# Patient Record
Sex: Female | Born: 1950 | Race: White | Hispanic: No | Marital: Married
Health system: Northeastern US, Academic
[De-identification: ages and names within clinical notes are randomized; demographics above are authoritative.]

---

## 2019-10-16 ENCOUNTER — Encounter: Admit: 2019-10-16 | Payer: PRIVATE HEALTH INSURANCE

## 2019-10-17 ENCOUNTER — Ambulatory Visit: Admit: 2019-10-17 | Payer: PRIVATE HEALTH INSURANCE

## 2019-10-17 DIAGNOSIS — Z23 Encounter for immunization: Secondary | ICD-10-CM

## 2019-11-03 ENCOUNTER — Inpatient Hospital Stay: Admit: 2019-11-03 | Payer: PRIVATE HEALTH INSURANCE | Attending: Certified Registered"

## 2019-11-03 ENCOUNTER — Inpatient Hospital Stay: Admit: 2019-11-03 | Payer: PRIVATE HEALTH INSURANCE

## 2019-11-03 ENCOUNTER — Encounter: Admit: 2019-11-03 | Payer: PRIVATE HEALTH INSURANCE

## 2019-11-03 ENCOUNTER — Inpatient Hospital Stay: Admit: 2019-11-03 | Discharge: 2019-11-10 | Payer: MEDICARE | Source: Home / Self Care | Admitting: Hospitalist

## 2019-11-03 DIAGNOSIS — K219 Gastro-esophageal reflux disease without esophagitis: Secondary | ICD-10-CM

## 2019-11-03 DIAGNOSIS — I10 Essential (primary) hypertension: Secondary | ICD-10-CM

## 2019-11-03 DIAGNOSIS — Z6839 Body mass index (BMI) 39.0-39.9, adult: Secondary | ICD-10-CM

## 2019-11-03 DIAGNOSIS — G250 Essential tremor: Secondary | ICD-10-CM

## 2019-11-03 DIAGNOSIS — F1020 Alcohol dependence, uncomplicated: Secondary | ICD-10-CM

## 2019-11-03 DIAGNOSIS — S93409A Sprain of unspecified ligament of unspecified ankle, initial encounter: Secondary | ICD-10-CM

## 2019-11-03 DIAGNOSIS — E669 Obesity, unspecified: Secondary | ICD-10-CM

## 2019-11-03 DIAGNOSIS — E785 Hyperlipidemia, unspecified: Secondary | ICD-10-CM

## 2019-11-03 DIAGNOSIS — K922 Gastrointestinal hemorrhage, unspecified: Secondary | ICD-10-CM

## 2019-11-03 DIAGNOSIS — F1011 Alcohol abuse, in remission: Secondary | ICD-10-CM

## 2019-11-03 DIAGNOSIS — L719 Rosacea, unspecified: Secondary | ICD-10-CM

## 2019-11-03 DIAGNOSIS — F32A Depressive disorder: Secondary | ICD-10-CM

## 2019-11-03 DIAGNOSIS — F411 Generalized anxiety disorder: Secondary | ICD-10-CM

## 2019-11-03 LAB — CBC WITH AUTO DIFFERENTIAL
BKR WAM ABSOLUTE IMMATURE GRANULOCYTES.: 0.03 x 1000/ÂµL (ref 0.00–0.10)
BKR WAM ABSOLUTE IMMATURE GRANULOCYTES.: 0.05 x 1000/ÂµL (ref 0.00–0.10)
BKR WAM ABSOLUTE IMMATURE GRANULOCYTES.: 0.02 x 1000/ÂµL (ref 0.00–0.10)
BKR WAM ABSOLUTE LYMPHOCYTE COUNT.: 1.17 x 1000/ÂµL (ref 1.00–4.50)
BKR WAM ABSOLUTE LYMPHOCYTE COUNT.: 0.64 x 1000/ÂµL — ABNORMAL LOW (ref 1.00–4.50)
BKR WAM ABSOLUTE LYMPHOCYTE COUNT.: 0.9 x 1000/ÂµL — ABNORMAL LOW (ref 1.00–4.50)
BKR WAM ABSOLUTE NEUTROPHIL COUNT.: 4.13 x 1000/ÂµL (ref 1.40–6.60)
BKR WAM ABSOLUTE NEUTROPHIL COUNT.: 4.35 x 1000/ÂµL (ref 1.40–6.60)
BKR WAM ABSOLUTE NEUTROPHIL COUNT.: 3.12 x 1000/ÂµL (ref 1.40–6.60)
BKR WAM BASOPHIL ABSOLUTE COUNT.: 0.06 x 1000/ÂµL (ref 0.0–0.3)
BKR WAM BASOPHIL ABSOLUTE COUNT.: 0.04 x 1000/ÂµL (ref 0.0–0.3)
BKR WAM BASOPHIL ABSOLUTE COUNT.: 0.02 x 1000/ÂµL (ref 0.0–0.3)
BKR WAM BASOPHILS: 0.9 % (ref 0.0–3.0)
BKR WAM BASOPHILS: 0.7 % (ref 0.0–3.0)
BKR WAM BASOPHILS: 0.4 % (ref 0.0–3.0)
BKR WAM EOSINOPHIL ABSOLUTE COUNT.: 0.16 x 1000/ÂµL (ref 0.00–0.45)
BKR WAM EOSINOPHIL ABSOLUTE COUNT.: 0.01 x 1000/ÂµL (ref 0.00–0.45)
BKR WAM EOSINOPHIL ABSOLUTE COUNT.: 0.02 x 1000/ÂµL (ref 0.00–0.45)
BKR WAM EOSINOPHILS: 2.5 % (ref 0.0–4.0)
BKR WAM EOSINOPHILS: 0.2 % (ref 0.0–4.0)
BKR WAM EOSINOPHILS: 0.4 % (ref 0.0–4.0)
BKR WAM HEMATOCRIT: 36.4 % (ref 34.0–45.0)
BKR WAM HEMATOCRIT: 33.7 % — ABNORMAL LOW (ref 34.0–45.0)
BKR WAM HEMATOCRIT: 29 % — ABNORMAL LOW (ref 34.0–45.0)
BKR WAM HEMOGLOBIN: 12.1 g/dL (ref 11.0–15.0)
BKR WAM HEMOGLOBIN: 10.9 g/dL — ABNORMAL LOW (ref 11.0–15.0)
BKR WAM HEMOGLOBIN: 9.8 g/dL — ABNORMAL LOW (ref 11.0–15.0)
BKR WAM IMMATURE GRANULOCYTES: 0.5 % (ref 0.0–1.0)
BKR WAM IMMATURE GRANULOCYTES: 0.9 % (ref 0.0–1.0)
BKR WAM IMMATURE GRANULOCYTES: 0.4 % (ref 0.0–1.0)
BKR WAM LYMPHOCYTES: 18 % — ABNORMAL LOW (ref 25.0–45.0)
BKR WAM LYMPHOCYTES: 11 % — ABNORMAL LOW (ref 25.0–45.0)
BKR WAM LYMPHOCYTES: 17.5 % — ABNORMAL LOW (ref 25.0–45.0)
BKR WAM MCH PG: 31.8 pg (ref 27–33)
BKR WAM MCH PG: 31.1 pg (ref 27–33)
BKR WAM MCH PG: 31.8 pg (ref 27–33)
BKR WAM MCHC: 33.2 g/dL (ref 32.0–36.0)
BKR WAM MCHC: 32.3 g/dL (ref 32.0–36.0)
BKR WAM MCHC: 33.8 g/dL (ref 32.0–36.0)
BKR WAM MCV: 95.5 fL (ref 79.0–99.0)
BKR WAM MCV: 96.3 fL (ref 79.0–99.0)
BKR WAM MCV: 94.2 fL (ref 79.0–99.0)
BKR WAM MONOCYTE ABSOLUTE COUNT.: 0.95 x 1000/ÂµL (ref 0.00–1.20)
BKR WAM MONOCYTE ABSOLUTE COUNT.: 0.72 x 1000/ÂµL (ref 0.00–1.20)
BKR WAM MONOCYTE ABSOLUTE COUNT.: 1.07 x 1000/ÂµL (ref 0.00–1.20)
BKR WAM MONOCYTES: 14.6 % — ABNORMAL HIGH (ref 0.0–12.0)
BKR WAM MONOCYTES: 12.4 % — ABNORMAL HIGH (ref 0.0–12.0)
BKR WAM MONOCYTES: 20.8 % — ABNORMAL HIGH (ref 0.0–12.0)
BKR WAM MPV: 8.9 fL (ref 7.5–11.5)
BKR WAM MPV: 8.9 fL (ref 7.5–11.5)
BKR WAM MPV: 9 fL (ref 7.5–11.5)
BKR WAM NEUTROPHILS: 63.5 % (ref 36.0–66.0)
BKR WAM NEUTROPHILS: 74.8 % — ABNORMAL HIGH (ref 36.0–66.0)
BKR WAM NEUTROPHILS: 60.5 % (ref 36.0–66.0)
BKR WAM NUCLEATED RED BLOOD CELLS: 0 % (ref 0.0–5.0)
BKR WAM NUCLEATED RED BLOOD CELLS: 0 % (ref 0.0–5.0)
BKR WAM NUCLEATED RED BLOOD CELLS: 0 % (ref 0.0–5.0)
BKR WAM PLATELETS: 196 x1000/ÂµL (ref 140–400)
BKR WAM PLATELETS: 184 x1000/ÂµL (ref 140–400)
BKR WAM PLATELETS: 159 x1000/ÂµL (ref 140–400)
BKR WAM RDW-CV: 13 % (ref 11.5–14.5)
BKR WAM RDW-CV: 12.8 % (ref 11.5–14.5)
BKR WAM RDW-CV: 13 % (ref 11.5–14.5)
BKR WAM RED BLOOD CELL COUNT.: 3.81 M/ÂµL — ABNORMAL LOW (ref 4.00–5.20)
BKR WAM RED BLOOD CELL COUNT.: 3.5 M/ÂµL — ABNORMAL LOW (ref 4.00–5.20)
BKR WAM RED BLOOD CELL COUNT.: 3.08 M/ÂµL — ABNORMAL LOW (ref 4.00–5.20)
BKR WAM WHITE BLOOD CELL COUNT.: 6.5 x1000/ÂµL (ref 4.00–10.00)
BKR WAM WHITE BLOOD CELL COUNT.: 5.81 x1000/ÂµL (ref 4.00–10.00)
BKR WAM WHITE BLOOD CELL COUNT.: 5.15 x1000/ÂµL (ref 4.00–10.00)

## 2019-11-03 LAB — BASIC METABOLIC PANEL
BKR ANION GAP (LM): 13 mmol/L (ref 5–15)
BKR BLOOD UREA NITROGEN: 19 mg/dL — ABNORMAL HIGH (ref 7–18)
BKR CALCIUM: 8.8 mg/dL (ref 8.5–10.1)
BKR CHLORIDE: 103 mmol/L (ref 98–107)
BKR CO2: 22 mmol/L (ref 21–32)
BKR CREATININE: 1.18 mg/dL — ABNORMAL HIGH (ref 0.55–1.02)
BKR EGFR (AFR AMER) (LMC): 55 mL/min/{1.73_m2} (ref >60)
BKR EGFR (NON AFR AMER) (LMC): 45 mL/min/{1.73_m2} (ref >60)
BKR GLUCOSE: 121 mg/dL — ABNORMAL HIGH (ref 65–110)
BKR POTASSIUM: 4.8 mmol/L (ref 3.5–5.1)
BKR SODIUM: 138 mmol/L (ref 136–145)

## 2019-11-03 LAB — BLOOD GAS, ARTERIAL     (LMW)
BKR AA GRADIENT LM: 412 mmHg
BKR AA GRADIENT LM: 259 mmHg
BKR FIO2: 70 %
BKR FIO2: 70 %
BKR HCO3, ARTERIAL: 25.6 mmol/L (ref 22.0–28.0)
BKR HCO3, ARTERIAL: 25.5 mmol/L (ref 22.0–28.0)
BKR PCO2 ARTERIAL: 41 mmHg (ref 35–45)
BKR PCO2 ARTERIAL: 41 mmHg (ref 35–45)
BKR PH, ARTERIAL: 7.41 U (ref 7.35–7.45)
BKR PH, ARTERIAL: 7.41 U (ref 7.35–7.45)
BKR PO2, ARTERIAL: 36 mmHg — CL (ref 80–100)
BKR PO2, ARTERIAL: 189 mmHg — ABNORMAL HIGH (ref 80–100)

## 2019-11-03 LAB — HEPATIC FUNCTION PANEL
BKR ALANINE AMINOTRANSFERASE (ALT): 75 U/L (ref 12–78)
BKR ALBUMIN: 3 g/dL — ABNORMAL LOW (ref 3.4–5.0)
BKR ALKALINE PHOSPHATASE: 96 U/L (ref 45–117)
BKR ASPARTATE AMINOTRANSFERASE (AST): 82 U/L — ABNORMAL HIGH (ref 15–37)
BKR BILIRUBIN DIRECT (LMC LM): <0.1 mg/dL (ref 0.00–0.20)
BKR BILIRUBIN TOTAL: 0.4 mg/dL (ref 0.2–1.0)
BKR GLOBULIN: 3.5 g/dL (ref 2.5–5.0)
BKR PROTEIN TOTAL: 6.5 g/dL (ref 6.4–8.2)

## 2019-11-03 LAB — URINALYSIS WITH CULTURE REFLEX      (BH LMW YH)
BKR BILIRUBIN, UA MG/DL: NEGATIVE mg/dL
BKR GLUCOSE, UA MG/DL: NEGATIVE mg/dL
BKR HYALINE CASTS, UA INSTRUMENT: 21 /LPF — ABNORMAL HIGH (ref 0–2)
BKR KETONES, UA MG/DL: NEGATIVE mg/dL
BKR NITRITE, UA: POSITIVE — AB
BKR PH, UA: 6 (ref 5.0–7.0)
BKR PROTEIN, UA.: NEGATIVE mg/dL
BKR RBC/HPF INSTRUMENT: 4 /HPF — ABNORMAL HIGH (ref 0–3)
BKR SPECIFIC GRAVITY, UA: 1.012 (ref 1.003–1.035)
BKR URINE SQUAMOUS EPITHELIAL CELLS, UA (NUMERIC): 1 /HPF (ref 0–5)
BKR UROBILINOGEN, UA - ALPHA: NEGATIVE EU/dL
BKR WBC/HPF INSTRUMENT: 20 /HPF — ABNORMAL HIGH (ref 0–5)

## 2019-11-03 LAB — PROTIME AND INR
BKR PROTHROMBIN TIME: 10.7 s (ref 9.9–11.8)
ZZZBKR INR 1 DEC: 1 (ref 0.9–1.1)

## 2019-11-03 LAB — IRON AND TIBC
BKR IRON SATURATION (SCCCT BH GH LM): 24 % — ABNORMAL LOW (ref 27–40)
BKR IRON: 73 ug/dL (ref 50–170)
BKR TOTAL IRON BINDING CAPACITY (SCCCT  BH GH LM): 307 ug/dL (ref 250–450)

## 2019-11-03 LAB — PHOSPHORUS     (BH GH L LMW YH): BKR PHOSPHORUS: 3.8 mg/dL (ref 2.5–4.9)

## 2019-11-03 LAB — LACTIC ACID, PLASMA: BKR LACTATE: 5.2 mmol/L — CR (ref 0.4–2.0)

## 2019-11-03 LAB — AMMONIA     (BH GH L LMW YH): BKR AMMONIA: 31 umol/L (ref 11–32)

## 2019-11-03 LAB — REFLEX LACTIC ACID, PLASMA: BKR LACTATE: 1.2 mmol/L (ref 0.4–2.0)

## 2019-11-03 LAB — SARS COV-2 (COVID-19) RNA: BKR SARS-COV-2 RNA (COVID-19) (YH): NEGATIVE

## 2019-11-03 LAB — FERRITIN: BKR FERRITIN: 271 ng/mL (ref 10–291)

## 2019-11-03 LAB — MAGNESIUM: BKR MAGNESIUM: 1.7 mg/dL (ref 1.6–2.6)

## 2019-11-03 LAB — UA REFLEX CULTURE

## 2019-11-03 LAB — ETHANOL     (BH GH L LMW YH): BKR ETHANOL (LM WH): 0.07 g/dL — ABNORMAL HIGH (ref <0.01)

## 2019-11-03 LAB — LACTIC ACID, PLASMA (REFLEX 2H REPEAT): BKR LACTATE: 4.3 mmol/L — CR (ref 0.4–2.0)

## 2019-11-03 MED ORDER — THIAMINE HCL (VITAMIN B1) 100 MG/ML INJECTION SOLUTION
100 mg/mL | Freq: Every day | INTRAVENOUS | Status: DC
Start: 2019-11-03 — End: 2019-11-05
  Administered 2019-11-03 – 2019-11-04 (×2): 100 mL via INTRAVENOUS

## 2019-11-03 MED ORDER — CHLORHEXIDINE GLUCONATE 0.12 % MOUTHWASH
0.12 % | Freq: Two times a day (BID) | OROMUCOSAL | Status: DC
Start: 2019-11-03 — End: 2019-11-08
  Administered 2019-11-04 – 2019-11-07 (×8): 0.12 mL via OROMUCOSAL

## 2019-11-03 MED ORDER — POLYETHYLENE GLYCOL ORAL POWDER (BOWEL PREP)
17 gram/dose | Freq: Once | ORAL | Status: CP
Start: 2019-11-03 — End: ?
  Administered 2019-11-03: 22:00:00 17 gram/dose via ORAL

## 2019-11-03 MED ORDER — SODIUM CHLORIDE 0.9 % (FLUSH) INJECTION SYRINGE
0.9 % | INTRAVENOUS | Status: DC | PRN
Start: 2019-11-03 — End: 2019-11-10

## 2019-11-03 MED ORDER — PANTOPRAZOLE IV PUSH 40 MG VIAL & NS (ADULTS)
Freq: Two times a day (BID) | INTRAVENOUS | Status: DC
Start: 2019-11-03 — End: 2019-11-05
  Administered 2019-11-04 – 2019-11-05 (×3): 10.000 mL via INTRAVENOUS

## 2019-11-03 MED ORDER — PANTOPRAZOLE IV PUSH 40 MG VIAL & NS (ADULTS)
Freq: Two times a day (BID) | INTRAVENOUS | Status: DC
Start: 2019-11-03 — End: 2019-11-03

## 2019-11-03 MED ORDER — PANTOPRAZOLE 40 MG INTRAVENOUS SOLUTION
40 mg | Freq: Once | INTRAVENOUS | Status: CP
Start: 2019-11-03 — End: ?
  Administered 2019-11-03: 08:00:00 40 mL via INTRAVENOUS

## 2019-11-03 MED ORDER — FOLIC ACID 1 MG TABLET
1 mg | Freq: Every day | ORAL | Status: DC
Start: 2019-11-03 — End: 2019-11-10
  Administered 2019-11-06 – 2019-11-10 (×4): 1 mg via ORAL

## 2019-11-03 MED ORDER — ROCURONIUM 10 MG/ML INTRAVENOUS SOLUTION
10 mg/mL | Freq: Once | INTRAVENOUS | Status: CP
Start: 2019-11-03 — End: ?
  Administered 2019-11-03: 19:00:00 10 mL via INTRAVENOUS

## 2019-11-03 MED ORDER — MIDAZOLAM (PF) 1 MG/ML INJECTION SOLUTION
1 mg/mL | INTRAVENOUS | Status: DC | PRN
Start: 2019-11-03 — End: 2019-11-03

## 2019-11-03 MED ORDER — CEFTRIAXONE IV PUSH 1000 MG VIAL & STERILE WATER (ADULTS)
INTRAVENOUS | Status: CP
Start: 2019-11-03 — End: ?
  Administered 2019-11-03 – 2019-11-09 (×7): 10.000 mL via INTRAVENOUS

## 2019-11-03 MED ORDER — EPHEDRINE (PF) 25 MG/5 ML (5 MG/ML) IN 0.9% SODIUM CHLORIDE IV SYRINGE
25 mg/5 mL (5 mg/mL) | Status: CP
Start: 2019-11-03 — End: ?

## 2019-11-03 MED ORDER — THIAMINE HCL (VITAMIN B1) 100 MG TABLET
100 mg | Freq: Every day | ORAL | Status: DC
Start: 2019-11-03 — End: 2019-11-05

## 2019-11-03 MED ORDER — ALBUTEROL SULFATE 2.5 MG/3 ML (0.083 %) SOLUTION FOR NEBULIZATION
2.530.083 mg /3 mL (0.083 %) | RESPIRATORY_TRACT | Status: DC | PRN
Start: 2019-11-03 — End: 2019-11-10

## 2019-11-03 MED ORDER — MIDAZOLAM 1 MG/ML IN 0.9 % SODIUM CHLORIDE INTRAVENOUS
1 mg/mL | INTRAVENOUS | Status: DC | PRN
Start: 2019-11-03 — End: 2019-11-03

## 2019-11-03 MED ORDER — ONDANSETRON HCL (PF) 4 MG/2 ML INJECTION SOLUTION
4 mg/2 mL | Freq: Once | INTRAVENOUS | Status: CP
Start: 2019-11-03 — End: ?
  Administered 2019-11-03: 13:00:00 4 mL via INTRAVENOUS

## 2019-11-03 MED ORDER — OCTREOTIDE ACETATE 100 MCG/ML INJECTION SOLUTION
100 mcg/mL | Status: DC
Start: 2019-11-03 — End: 2019-11-03

## 2019-11-03 MED ORDER — LORAZEPAM 2 MG/ML INJECTION SOLUTION
2 mg/mL | INTRAVENOUS | Status: DC | PRN
Start: 2019-11-03 — End: 2019-11-03
  Administered 2019-11-03: 13:00:00 2 mL via INTRAVENOUS

## 2019-11-03 MED ORDER — SODIUM CHLORIDE 0.9 % INTRAVENOUS SOLUTION
INTRAVENOUS | Status: DC
Start: 2019-11-03 — End: 2019-11-03
  Administered 2019-11-03: 19:00:00 via INTRAVENOUS

## 2019-11-03 MED ORDER — MIDAZOLAM 1 MG/ML IN 0.9 % SODIUM CHLORIDE INTRAVENOUS
1 mg/mL | INTRAVENOUS | Status: DC
Start: 2019-11-03 — End: 2019-11-08
  Administered 2019-11-04 – 2019-11-07 (×17): 1 mL/h via INTRAVENOUS

## 2019-11-03 MED ORDER — ONDANSETRON HCL (PF) 4 MG/2 ML INJECTION SOLUTION
4 mg/2 mL | Freq: Once | INTRAVENOUS | Status: DC
Start: 2019-11-03 — End: 2019-11-08

## 2019-11-03 MED ORDER — MAGNESIUM CITRATE ORAL SOLUTION
Freq: Once | ORAL | Status: CP
Start: 2019-11-03 — End: ?
  Administered 2019-11-04: 14:00:00 296.000 mL via ORAL

## 2019-11-03 MED ORDER — MIDAZOLAM BOLUS FROM INFUSION
INTRAVENOUS | Status: DC | PRN
Start: 2019-11-03 — End: 2019-11-07
  Administered 2019-11-03 – 2019-11-07 (×6): 1.000 mL via INTRAVENOUS

## 2019-11-03 MED ORDER — PROPOFOL 10 MG/ML INTRAVENOUS EMULSION
10 mg/mL | Status: CP
Start: 2019-11-03 — End: ?
  Administered 2019-11-03 (×2): 10 mg/mL

## 2019-11-03 MED ORDER — SIMETHICONE 40 MG/0.6 ML ORAL DROPS,SUSPENSION
40 mg/0.6 mL | Status: DC | PRN
Start: 2019-11-03 — End: 2019-11-08

## 2019-11-03 MED ORDER — POLYETHYLENE GLYCOL ORAL POWDER (BOWEL PREP)
17 gram/dose | Freq: Once | ORAL | Status: CP
Start: 2019-11-03 — End: ?
  Administered 2019-11-04: 11:00:00 17 gram/dose via ORAL

## 2019-11-03 MED ORDER — NALTREXONE 50 MG TABLET
50 mg | Freq: Two times a day (BID) | ORAL | Status: AC
Start: 2019-11-03 — End: ?

## 2019-11-03 MED ORDER — ONDANSETRON 4 MG DISINTEGRATING TABLET
4 mg | Freq: Four times a day (QID) | ORAL | Status: DC | PRN
Start: 2019-11-03 — End: 2019-11-10

## 2019-11-03 MED ORDER — PROPOFOL 10 MG/ML INTRAVENOUS EMULSION
10 mg/mL | Status: CP
Start: 2019-11-03 — End: ?

## 2019-11-03 MED ORDER — SODIUM CHLORIDE 0.9 % IV BOLUS FROM BAG
0.9 % | INTRAVENOUS | Status: CP
Start: 2019-11-03 — End: ?
  Administered 2019-11-03: 18:00:00 0.9 mL/h via INTRAVENOUS

## 2019-11-03 MED ORDER — MIDAZOLAM BOLUS FROM INFUSION
INTRAVENOUS | Status: DC | PRN
Start: 2019-11-03 — End: 2019-11-03

## 2019-11-03 MED ORDER — MORPHINE 2 MG/ML INJECTION SYRINGE
2 mg/mL | INTRAVENOUS | Status: DC | PRN
Start: 2019-11-03 — End: 2019-11-06
  Administered 2019-11-03 – 2019-11-06 (×11): 2 mL via INTRAVENOUS

## 2019-11-03 MED ORDER — FAMOTIDINE 4 MG/ML IN STERILE WATER (ADULT)
INTRAVENOUS | Status: DC
Start: 2019-11-03 — End: 2019-11-03

## 2019-11-03 MED ORDER — MIDAZOLAM BOLUS FROM INFUSION
Freq: Once | INTRAVENOUS | Status: DC
Start: 2019-11-03 — End: 2019-11-03

## 2019-11-03 MED ORDER — OCTREOTIDE BOLUS FROM BAG
INTRAVENOUS | Status: CP
Start: 2019-11-03 — End: ?
  Administered 2019-11-03: 09:00:00 240.000 mL via INTRAVENOUS

## 2019-11-03 MED ORDER — IPRATROPIUM 0.5 MG-ALBUTEROL 3 MG (2.5 MG BASE)/3 ML NEBULIZATION SOLN
0.5-32.53 mg-3 mg(2.5 mg base)/3 mL | RESPIRATORY_TRACT | Status: DC | PRN
Start: 2019-11-03 — End: 2019-11-10
  Administered 2019-11-04: 18:00:00 0.5 mL via RESPIRATORY_TRACT

## 2019-11-03 MED ORDER — LORAZEPAM 2 MG/ML INJECTION SOLUTION
2 mg/mL | INTRAVENOUS | Status: DC | PRN
Start: 2019-11-03 — End: 2019-11-03
  Administered 2019-11-03: 09:00:00 2 mL via INTRAVENOUS

## 2019-11-03 MED ORDER — SODIUM CHLORIDE 0.9 % (FLUSH) INJECTION SYRINGE
0.9 % | Freq: Three times a day (TID) | INTRAVENOUS | Status: DC
Start: 2019-11-03 — End: 2019-11-10
  Administered 2019-11-04 – 2019-11-10 (×14): 0.9 mL via INTRAVENOUS

## 2019-11-03 MED ORDER — LORAZEPAM 2 MG/ML INJECTION SOLUTION
2 mg/mL | INTRAVENOUS | Status: DC | PRN
Start: 2019-11-03 — End: 2019-11-03

## 2019-11-03 MED ORDER — SODIUM CHLORIDE 0.9 % IV BOLUS FROM BAG
0.9 % | INTRAVENOUS | Status: CP
Start: 2019-11-03 — End: ?
  Administered 2019-11-03: 14:00:00 0.9 mL/h via INTRAVENOUS

## 2019-11-03 MED ORDER — SODIUM CHLORIDE 0.9 % INTRAVENOUS SOLUTION
INTRAVENOUS | Status: DC
Start: 2019-11-03 — End: 2019-11-06
  Administered 2019-11-03 – 2019-11-06 (×6): via INTRAVENOUS

## 2019-11-03 MED ORDER — MIDAZOLAM (PF) 1 MG/ML INJECTION SOLUTION
1 mg/mL | INTRAVENOUS | Status: DC | PRN
Start: 2019-11-03 — End: 2019-11-07
  Administered 2019-11-06: 15:00:00 1 mL via INTRAVENOUS

## 2019-11-03 MED ORDER — MIDAZOLAM 1 MG/ML IN 0.9 % SODIUM CHLORIDE INTRAVENOUS
1 mg/mL | INTRAVENOUS | Status: DC | PRN
Start: 2019-11-03 — End: 2019-11-03
  Administered 2019-11-03: 19:00:00 1 mL/h via INTRAVENOUS

## 2019-11-03 MED ORDER — OCTREOTIDE 600 MCG/240 ML NS
INTRAVENOUS | Status: DC
Start: 2019-11-03 — End: 2019-11-03
  Administered 2019-11-03 (×2): 240.000 mL/h via INTRAVENOUS

## 2019-11-03 MED ORDER — IPRATROPIUM 0.5 MG-ALBUTEROL 3 MG (2.5 MG BASE)/3 ML NEBULIZATION SOLN
0.5 mg-3 mg(2.5 mg base)/3 mL | Freq: Two times a day (BID) | RESPIRATORY_TRACT | Status: DC
Start: 2019-11-03 — End: 2019-11-08
  Administered 2019-11-04 – 2019-11-08 (×9): 0.5 mL via RESPIRATORY_TRACT

## 2019-11-03 MED ORDER — VECURONIUM BROMIDE 10 MG INTRAVENOUS SOLUTION
10 mg | Status: DC
Start: 2019-11-03 — End: 2019-11-08

## 2019-11-03 MED ORDER — PHENYLEPHRINE 1 MG/10 ML (100 MCG/ML) IN 0.9 % SOD.CHLORIDE IV SYRINGE
1 mg/0 mL (00 mcg/mL) | Status: CP
Start: 2019-11-03 — End: ?

## 2019-11-03 MED ORDER — FOLIC ACID 1 MG/0.2 ML SYRINGE
5 mg/mL | Freq: Every day | INTRAVENOUS | Status: DC
Start: 2019-11-03 — End: 2019-11-05
  Administered 2019-11-03 – 2019-11-04 (×2): 5 mL via INTRAVENOUS

## 2019-11-03 NOTE — Anesthesia Post-Procedure Evaluation
Anesthesia Post-op NotePatient: Melinda Hole B BrengleProcedure(s):  Procedure(s) (LRB):UPPER GI ENDOSCOPY; W/BX, SINGLE/MULTIPLE (N/A) Patient location: ICULast Vitals:  I have noted the vital signs as listed in the nursing notes.Mental status recovered: patient participates in evaluation: No, intubatedVital signs reviewed: YesRespiratory function stable:YesAirway is patent: YesCardiovascular function and hydration status stable: YesPain control satisfactory: YesNausea and vomiting control satisfactory:Yes

## 2019-11-03 NOTE — ED Notes
6:26 AM Patient resting on stretcher, in no distress, respirations unlabored, skin warm and dry. Patient reports tremors are at baseline for patient, offers no other complaints at this time. VSS. Will continue to monitor and assess.

## 2019-11-03 NOTE — Plan of Care
CIWA at 1053 was 9. Provider d/c'd CIWA protocol and MINDS was orders and Ativan not given according to MINDS score

## 2019-11-03 NOTE — Other
Patient Data:  Patient Name: Melinda George Age: 69 y.o. DOB: 1950-12-20	 MRN: [id number]	 Gastroenterology Consult NoteConsultation requested by: Attending Provider: Marquis Buggy, MD [phone] for consultation:  Hematemesis history of alcoholism anemiaAdmission Date: 3/17/2021Encounter Date: 03/17/21HISTORY OF PRESENT PROBLEM Melinda George is a 69 y.o.  female with past medical history significant for hypertension, dyslipidemia, essential tremor, class 2 obesity, generalized anxiety disorder, depression, and alcohol dependence and abuse who has been actively drinking since 2012. Patient admits to drinking 1.5 liters of wine daily plus another 750 milliliters or 3 bottles of wine or more daily.  She is currently involved in a computer monitored program designed to allow her to slowly decrease alcohol.  She states that she has been unable to wean off alcohol using this program over the last 4 months her husband and she agree that she has continued to drink heavily every day.  Patient describes having been without alcohol from 2007 to 2012 after going to [facility].  Patient has been feeling okay until 1 or 2 days prior to admission when she had loss of appetite and not eaten or drank a lot.  She was cleaning up dog vomit and began dry heaving around 1:00 a.m. this morning.  The nausea persisted and she continued to vomit and then had bright red blood in the vomitus and having loose stools that also had red blood and dark blood in it.  She had no specific complaints of mouth sores odynophagia dysphagia or heartburn or indigestion of though she does have chronic reflux.  She did not have specific complaints of fevers chills night sweats or weight loss she was having no specific abdominal pain or cramping just had urgent call to the bathroom passing bright red blood.  She came to the emergency room for further evaluation where she had some more hematemesis.  In the emergency room her blood pressure was softer BUN 19 creatinine 1.180 AST 82 ALT 75 alk-phos 96 total bilirubin of 0.1 hemoglobin initially 12.1 falling to 10.9 on repeat.  Patient was started urgently on octreotide received IV pantoprazole and transferred to the intensive care unit.Patient has used alcohol as a sedative in treatment for her central tremor she has been seen by a yell specialist tried on gabapentin that caused her mouth to be too dry.Patient is going to be intubated for urgent endoscopy and may require persistent intubation for potential alcohol withdrawal. Her husband is at the bedside history is confirmed with him.Medical History: ZHY:QMVH Medical History: Diagnosis Date ? Alcohol abuse, in remission 12/15/2011 ? Alcohol dependence (HC Code)  ? Body mass index 39.0-39.9, adult 06/02/2013 ? Depressive disorder 12/21/2009 ? Essential hypertension 12/21/2009  CONT CURRENT MEDS, CR AUG 2014 0.9 ? Essential tremor 08/22/2010 ? Gastroesophageal reflux disease 12/21/2009 ? Generalized anxiety disorder 12/21/2009 ? Hyperlipidemia 12/21/2009 ? Obesity 05/28/2013 ? Rosacea 12/21/2009 ? Sprain of ankle 04/30/2010 QIO:NGEX Surgical History: Procedure Laterality Date ? COLONOSCOPY  12/12/2010  DONE AT [facility] RECALL IN 5 YEARS ? HYSTERECTOMY   ? LUMBAR DISC SURGERY  08/19/1992  LUMBAR ? TOTAL ABDOMINAL HYSTERECTOMY W/ BILATERAL SALPINGOOPHORECTOMY  08/20/1995 ? TOTAL ELBOW REPLACEMENT  05/2014  [email or web address] Meds: Lactose (bulk), Lanolin, and NickelPrior to Admission medications  Medication Sig Start Date End Date Taking? Authorizing Provider escitalopram oxalate (LEXAPRO) 20 MG tablet TAKE 1 TABLET BY MOUTH EVERY DAY 09/29/17  Yes Kingsley Plan, MD gabapentin (NEURONTIN) 300 MG capsule Take 300 mg by mouth 2 (two) times daily  with breakfast and dinner. TAKE 1 OR 2 CAPSULES AT BEDTIME 11/03/17  Yes [provider] naltrexone (REVIA) 50 mg tablet Take 50 mg by mouth 2 (two) times daily. 10/18/19  Yes [provider] nebivoloL (BYSTOLIC) 20 mg tablet Take 1 tablet (20 mg total) by mouth daily. 03/15/19  Yes Kingsley Plan, MD lactobacillus rhamnosus, GG, (CULTURELLE) 10 billion cell capsule Take 1 capsule by mouth daily.    [provider] rosuvastatin (CRESTOR) 10 mg tablet TAKE 1 TABLET BY MOUTH EVERY DAYPatient not taking: Reported on 11/03/2019 06/23/18 11/03/19  Artis Delay, NP triamcinolone (KENALOG) 0.1 % cream Apply topically 2 (two) times daily For eczemaPatient not taking: Reported on 11/03/2019 08/28/17 11/03/19  Kingsley Plan, MD Current Facility-Administered Medications: ?  [MAR Hold] albuterol neb sol 2.5 mg/3 mL (0.083%) (PROVENTIL,VENTOLIN), 2.5 mg, Nebulization, Q4H PRN, Burgess Estelle, Ahmed, MD?  Mitzi Hansen Hold] chlorhexidine gluconate (PERIDEX) 0.12 % solution 15 mL, 15 mL, Mouth/Throat, Q12H, Elshazly, Ahmed, MD?  Mitzi Hansen Hold] folic acid (FOLVITE) tablet 1 mg, 1 mg, Oral, Daily, Main, Marnee Spring, MD?  Mitzi Hansen Hold] folic acid syringe 1 mg, 1 mg, IV Push, Daily, Main, Marnee Spring, MD, 1 mg at 11/03/19 1421?  [MAR Hold] ipratropium-albuteroL (DUO-NEB) 0.5 mg-3 mg(2.5 mg base)/3 mL nebulizer solution 3 mL, 3 mL, Nebulization, Q12H, Elshazly, Ahmed, MD?  Mitzi Hansen Hold] ipratropium-albuteroL (DUO-NEB) 0.5 mg-3 mg(2.5 mg base)/3 mL nebulizer solution 3 mL, 3 mL, Nebulization, Q4H PRN, Burgess Estelle, Ahmed, MD?  Mitzi Hansen Hold] midazolam (PF) (VERSED) injection 1 mg, 1 mg, IV Push, Q4H PRN, Burgess Estelle, Ahmed, MD?  midazolam (VERSED) 1 mg/mL in sodium chloride 0.9% infusion, 0.5-20 mg/hr, Intravenous, Continuous PRN, Burgess Estelle, Ahmed, MD?  Mitzi Hansen Hold] midazolam (VERSED) bolus from bag 0.5 mg, 0.5 mg, Intravenous, Q30 MIN PRN, Burgess Estelle, Ahmed, MD?  [facility] Hold] morphine syringe 2 mg, 2 mg, IV Push, Q1H PRN, Burgess Estelle, Ahmed, MD?  octreotide (SandoSTATIN) 600 mcg in sodium chloride 0.9% 240 mL (2.5 mcg/mL) infusion, 50 mcg/hr, Intravenous, Continuous, Main, Marnee Spring, MD, Last Rate: 20 mL/hr at 11/03/19 0452, 50 mcg/hr at 11/03/19 0452?  [MAR Hold] ondansetron (PF) (ZOFRAN) injection 4 mg, 4 mg, IV Push, Once, Nicholes Stairs, MD?  Mitzi Hansen Hold] ondansetron (ZOFRAN-ODT) disintegrating tablet 4 mg, 4 mg, Oral, Q6H PRN, Main, Marnee Spring, MD?  Mitzi Hansen Hold] pantoprazole (PROTONIX) 40 mg in sodium chloride 0.9% PF 10 mL (4 mg/mL), 40 mg, IV Push, Q12H, Elshazly, Ahmed, MD?  Propofol - Injection (DIPRIVAN) 10 mg/mL IV push only, , , , ?  [MAR Hold] sodium chloride 0.9 % flush 3 mL, 3 mL, IV Push, Q8H, Main, Marnee Spring, MD?  [facility] Hold] sodium chloride 0.9 % flush 3 mL, 3 mL, IV Push, PRN for Line Care, Main, Marnee Spring, MD?  sodium chloride 0.9% infusion, 100 mL/hr, Intravenous, Continuous, Main, Marnee Spring, MD, Last Rate: 100 mL/hr at 11/03/19 1139, 100 mL/hr at 11/03/19 1139?  [MAR Hold] thiamine (VITAMIN B1) injection 100 mg, 100 mg, IV Push, Daily, Main, Marnee Spring, MD, 100 mg at 11/03/19 0431?  [MAR Hold] thiamine (VITAMIN B1) tablet 100 mg, 100 mg, Oral, Daily, Main, Marnee Spring, MDFH:family history includes ADHD in her brother and another family member; Alcohol abuse in an other family member; Breast cancer in her maternal aunt and paternal aunt; Depression in an other family member; Endometriosis in an other family member; Heart disease in her father and another family member; Hypertension in an other family member; Osteoporosis in her maternal grandmother and another family member; Preeclampsia in  her sister and another family member; Stroke in her maternal grandmother and another family member; Thyroid disease in her sister and another family member.ZO:XWRUEA History Socioeconomic History ? Marital status: Married   Spouse name: Not on file ? Number of children: Not on file ? Years of education: Not on file ? Highest education level: Not on file Occupational History ? Not on file Social Needs ? Financial resource strain: Not on file ? Food insecurity   Worry: Not on file   Inability: Not on file ? Transportation needs   Medical: Not on file   Non-medical: Not on file Tobacco Use ? Smoking status: Former Smoker   Packs/day: 1.00   Years: 10.00   Pack years: 10.00   Types: Cigarettes ? Smokeless tobacco: Never Used Substance and Sexual Activity ? Alcohol use: Yes   Alcohol/week: 84.0 standard drinks   Types: 84 Glasses of wine per week   Frequency: 4 or more times a week   Drinks per session: 3 or 4   Binge frequency: Daily or almost daily   Comment: Patient drinks 3 bottles of wine daily, sometimes more ? Drug use: No ? Sexual activity: Yes   Partners: Male Lifestyle ? Physical activity   Days per week: Not on file   Minutes per session: Not on file ? Stress: Not on file Relationships ? Social Manufacturing systems engineer on phone: Not on file   Gets together: Not on file   Attends religious service: Not on file   Active member of club or organization: Not on file   Attends meetings of clubs or organizations: Not on file   Relationship status: Not on file ? Intimate partner violence   Fear of current or ex partner: Not on file   Emotionally abused: Not on file   Physically abused: Not on file   Forced sexual activity: Not on file Other Topics Concern ? Not on file Social History Narrative ? Not on file Review of Systems: Review of Systems Constitutional: Positive for malaise/fatigue. Negative for chills, diaphoresis, fever and weight loss. HENT: Negative.  Eyes: Negative.  Respiratory: Negative.  Cardiovascular: Negative.  Gastrointestinal: Positive for blood in stool, heartburn, melena, nausea and vomiting. Negative for abdominal pain, constipation and diarrhea. Genitourinary: Negative.  Musculoskeletal: Positive for joint pain. Skin: Negative. Neurological:      Essential tremor Endo/Heme/Allergies: Negative.  Psychiatric/Behavioral: Positive for depression and substance abuse. The patient is nervous/anxious.  Vitals: Wt Readings from Last 1 Encounters: 11/03/19 104.4 kg  Ht Readings from Last 1 Encounters: 11/03/19 5' 6 (1.676 m)  Body surface area is 2.2 meters squared. BP Readings from Last 1 Encounters: 11/03/19 116/60  Pulse Readings from Last 1 Encounters: 11/03/19 (!) 106  Resp Readings from Last 1 Encounters: 11/03/19 20  Temp Readings from Last 1 Encounters: 11/03/19 99 ?F (37.2 ?C) (Temporal)  SpO2 Readings from Last 1 Encounters: 11/03/19 100%  Body mass index is 37.15 kg/m?Marland Kitchen  Physical Exam:  GENERAL:  pleasant and cooperative in no acute distress.  Disheveled overweight with a central tremorHEENT:  Anicteric   oropharynx is benignSkin is thinNodes no supraclavicular axillary inguinal adenopathyNECK: Supple; no LNs; no JVD CHEST: Clear to auscultation and percussion BACK:  Without CVA or SPTHEART: RRR, S1S2; No murmur ABDOMEN: Positive bowel sounds in all four quadrants, centripetal weight liver edge down probable spleen tip no focal tenderness melena has been described no rectal exam was repeatedEXTREMITIES:  No cyanosis, clubbing, or edema pulses intactNEUROLOGIC: nonfocal significant essential tremor cannot  assess asterixis moves all 4 extremities did not get her out of bed she is alert and oriented x3 she is a good historian she and her husband are conversing during the interview and examReview of Labs/Diagnostics:   Labs:Recent Labs Lab 03/17/210406 03/17/210851 NA 138  --  K 4.8  --  CL 103  --  CO2 22  --  BUN 19*  --  CREATININE 1.18*  --  CALCIUM 8.8  --  PROT 6.5  --  BILITOT 0.4  --  ALKPHOS 96  --  ALT 75  --  AST 82*  --  GLU 121*  --  INR 1.0  --  WBC 6.50 5.81 HGB 12.1 10.9* HCT 36.4 33.7* PLT 196 184 Diagnostics:Radiology Results (last 7 days)   ** No results found for the last 168 hours. **  Diagnostic Review:Assessment + Plan Melinda George is a 69 y.o.  female who presents hypertension, dyslipidemia, essential tremor, alcohol abuse and dependence who was cleaning up dog vomit dry heaves and began vomiting herself with red blood and then passing red blood and melena.  Differential diagnosis certainly in the face of alcohol includes esophageal variceal bleeding Mallory-Weiss tears in the differential patient does drink significant alcohol making gastritis gastric ulcer duodenitis duodenal ulcer esophagitis esophageal ulcer with without H pylori along with esophageal varices in the differential.Patient is not a colonoscopy since 2012 she does not remember the details patient has dropped hemoglobin hematocrit is going to need blood transfusion.IV octreotide and IV pantoprazole have been startedWill give IV antibiotics in anticipation for variceal bleeding and treatment with endoscopic band ligationPreparation for alcohol withdrawal should be made certainly multiple vitamins with minerals thiamin folate should be started along with appropriate IV fluid resuscitation.  Patient is going to need abdominal ultrasound serologies for hepatitis AB and C in appropriately be vaccinated as well.Patient is going need ammonia tract serial hemoglobin hematocrit she understands the reason for intubation and will talk further with anaesthesiaHer lactic acid elevation is of concern I think she should get packed red cellsPatient is significantly overweight with class 2 obesity risk of fatty liver and cirrhosis that may be presents related to alcohol and fatty liver giving rise to esophageal varices as well.Patient's abnormal liver function tests AST greater than ALT consistent with alcohol Plan:1. Admitting to ICU2. Type and screen blood on reserve probable transfusion of 2 units of front3. IV octreotide bolus and drip, IV pantoprazole 40 milligrams IV Q 124. Serial hemoglobin hematocrit5. Check ammonia6. IV antibiotic pre EGD possible variceal band ligation7. Imaging of right upper quadrant assess liver rule out fatty liver rule out cirrhosis8. Check hepatitis AB andC serology9. Anticipate a alcohol withdrawal10. Mvi with mineral thiamin folate11. DVT prophylaxis with compression no anticoagulation12. IV fluids checking magnesium phosphorus13. Inpatient alcohol rehab may be appropriate given her success with this in the past social service may be able to help with this when she is extubated14. Her central tremor is certainly an issue patient using alcohol to treat this15. Severe obesity also should be addressedExtended bedside review with the patient and her husband prior to intubation Signed:Addysin George Jodelle Green, MDCell Preferred Contact: [phone] Contact: [phone] Fax:  [phone] Address:  [address]                           [place], [place] [address] 11/03/2019

## 2019-11-03 NOTE — H&P
[facility]	 [facility] History & PhysicalHistory provided by: the patient and EMR reviewHistory limited by: no limitationsPatient presents from: HomeSubjective: Chief Complaint:  Nausea, vomiting bloodHPI patient is a 69 year old woman who has been battling alcohol dependence for many years, patient admits to drinking 1.5 liters of wine daily along with another 750 milliliters daily or 3 bottles of wine or more daily.  She has been doing that for years.  She is currently involved in the RIA program with the therapist, psychiatrist and breathalyzer through the computer.  She has decreased oral intake of food and water.  She was doing well until approximately 1:00 a.m. when she woke up nauseous and began retching and noted bright red blood per rectum.  In the ED she had of loose bowel movement she states was dark.  She denies abdominal pain, fever, chills.  Laboratory evaluation shows a BUN 19, creatinine 1.18, AST 82, ALT 75, alkaline phos 96, bilirubin less than 0.1, hemoglobin 12.1, hematocrit 36.4%.  INR 1.0.Medical History: PMH PSH Past Medical History: Diagnosis Date ? Alcohol abuse, in remission 12/15/2011 ? Alcohol dependence (HC Code)  ? Body mass index 39.0-39.9, adult 06/02/2013 ? Depressive disorder 12/21/2009 ? Essential hypertension 12/21/2009  CONT CURRENT MEDS, CR AUG 2014 0.9 ? Essential tremor 08/22/2010 ? Gastroesophageal reflux disease 12/21/2009 ? Generalized anxiety disorder 12/21/2009 ? Hyperlipidemia 12/21/2009 ? Obesity 05/28/2013 ? Rosacea 12/21/2009 ? Sprain of ankle 04/30/2010  Past Surgical History: Procedure Laterality Date ? COLONOSCOPY  12/12/2010  DONE AT [facility] DISEAS[facility] RECALL IN 5 YEARS ? HYSTERECTOMY   ? LUMBAR DISC SURGERY  08/19/1992  LUMBAR ? TOTAL ABDOMINAL HYSTERECTOMY W/ BILATERAL SALPINGOOPHORECTOMY  08/20/1995 ? TOTAL ELBOW REPLACEMENT  05/2014  [email or web address]   Family History family history includes ADHD in her brother and another family member; Alcohol abuse in an other family member; Breast cancer in her maternal aunt and paternal aunt; Depression in an other family member; Endometriosis in an other family member; Heart disease in her father and another family member; Hypertension in an other family member; Osteoporosis in her maternal grandmother and another family member; Preeclampsia in her sister and another family member; Stroke in her maternal grandmother and another family member; Thyroid disease in her sister and another family member.Social History  reports that she has quit smoking. Her smoking use included cigarettes. She has a 10.00 pack-year smoking history. She has never used smokeless tobacco. She reports current alcohol use of about 8.0 standard drinks of alcohol per week. She reports that she does not use drugs.Prior to Admission Medications Prior to Admission medications  Medication Sig Start Date End Date Taking? Authorizing Provider escitalopram oxalate (LEXAPRO) 20 MG tablet TAKE 1 TABLET BY MOUTH EVERY DAY 09/29/17   Kingsley Plan, MD gabapentin (NEURONTIN) 300 MG capsule TAKE 1 OR 2 CAPSULES AT BEDTIME 11/03/17   [provider] lactobacillus rhamnosus, GG, (CULTURELLE) 10 billion cell capsule Take 1 capsule by mouth daily.    [provider] nebivoloL (BYSTOLIC) 20 mg tablet Take 1 tablet (20 mg total) by mouth daily. 03/15/19   Kingsley Plan, MD rosuvastatin (CRESTOR) 10 mg tablet TAKE 1 TABLET BY MOUTH EVERY DAY 06/23/18   Artis Delay, NP triamcinolone (KENALOG) 0.1 % cream Apply topically 2 (two) times daily For eczema 08/28/17   Kingsley Plan, MD  Allergies Allergies Allergen Reactions ? Lanolin  ? Nickel  ? Lactose (Bulk) Rash Review of Systems: Review of Systems Constitutional: Positive for appetite change (Decreased). Negative for  chills and fever. HENT: Negative.  Eyes: Negative.  Respiratory: Negative for cough and shortness of breath.  Cardiovascular: Negative for chest pain, palpitations and leg swelling. Gastrointestinal: Positive for nausea and vomiting. Negative for abdominal pain and constipation.      She admits to hematemesis, melena, denies hematochezia Endocrine: Negative for polydipsia, polyphagia and polyuria. Genitourinary: Negative for dysuria, frequency, urgency and vaginal bleeding. Musculoskeletal: Negative for back pain and neck pain. Skin: Negative for rash. Neurological: Negative for dizziness, facial asymmetry, light-headedness and headaches. Hematological: Negative.  Psychiatric/Behavioral: Negative.   Objective: Vitals:Last 24 hours: Temp:  [99.3 ?F (37.4 ?C)] 99.3 ?F (37.4 ?C)Pulse:  [78-85] 79Resp:  [17-21] 20BP: (110-163)/(59-91) 145/59SpO2:  [95 %-99 %] 95 %Physical Exam: Physical Exam Constitutional: She is oriented to person, place, and time. Moderately obese woman in bed, in no acute distress, anxious, mildly tremulous. HENT: Head: Normocephalic. Eyes: Conjunctivae are normal. No scleral icterus. Neck: Neck supple. No JVD present. Cardiovascular: Normal rate, normal heart sounds and intact distal pulses. Pulmonary/Chest: Effort normal and breath sounds normal. Abdominal: Soft. She exhibits no distension. There is no abdominal tenderness. There is no rebound. Musculoskeletal:       General: No tenderness or edema. Neurological: She is alert and oriented to person, place, and time. Skin: Skin is warm. Psychiatric: She has a normal mood and affect.  Labs: Last 24 hours: Recent Results (from the past 24 hour(s)) Protime and INR  Collection Time: 11/03/19  4:06 AM Result Value Ref Range  Prothrombin Time 10.7 9.9 - 11.8 seconds  INR 1.0 0.9 - 1.1 Type and screen  Collection Time: 11/03/19  4:06 AM Result Value Ref Range  ABO Grouping O   Rh Type POS   Antibody Screen NEG  Hepatic function panel  Collection Time: 11/03/19  4:06 AM Result Value Ref Range  Alkaline Phosphatase 96 45 - 117 U/L  Alanine Aminotransferase (ALT) 75 12 - 78 U/L  Aspartate Aminotransferase (AST) 82 (H) 15 - 37 U/L  Total Protein 6.5 6.4 - 8.2 g/dL  Albumin 3.0 (L) 3.4 - 5.0 g/dL  Globulin 3.5 2.5 - 5.0 g/dL  Bilirubin, Direct <3.66 0.00 - 0.20 mg/dL  Total Bilirubin 0.4 0.2 - 1.0 mg/dL Ethanol  Collection Time: 11/03/19  4:06 AM Result Value Ref Range  Ethanol Level 0.07 (H) <0.01 g/dL Basic metabolic panel  Collection Time: 11/03/19  4:06 AM Result Value Ref Range  Glucose 121 (H) 65 - 110 mg/dL  BUN 19 (H) 7 - 18 mg/dL  Creatinine 4.40 (H) 3.47 - 1.02 mg/dL  eGFR (AFRICAN-AMERICAN) 55 >60 mL/min/1.5m2  eGFR (NON African-American) 45 >60 mL/min/1.8m2  Sodium 138 136 - 145 mmol/L  Potassium 4.8 3.5 - 5.1 mmol/L  Chloride 103 98 - 107 mmol/L  CO2 22 21 - 32 mmol/L  Anion Gap 13 5 - 15 mmol/L  Calcium 8.8 8.5 - 10.1 mg/dL CBC auto differential  Collection Time: 11/03/19  4:06 AM Result Value Ref Range  WBC 6.50 4.00 - 10.00 x1000/?L  RBC 3.81 (L) 4.00 - 5.20 M/?L  Hemoglobin 12.1 11.0 - 15.0 g/dL  Hematocrit 42.5 95.6 - 45.0 %  MCV 95.5 79.0 - 99.0 fL  MCH 31.8 27 - 33 pg  MCHC 33.2 32.0 - 36.0 g/dL  RDW-CV 38.7 56.4 - 33.2 %  Platelets 196 140 - 400 x1000/?L  MPV 8.9 7.5 - 11.5 fL  Neutrophils 63.5 36.0 - 66.0 %  Lymphocytes 18.0 (L) 25.0 - 45.0 %  Monocytes 14.6 (H) 0.0 - 12.0 %  Eosinophils 2.5 0.0 - 4.0 %  Basophil 0.9 0.0 - 3.0 %  Immature Granulocytes 0.5 0.0 - 1.0 %  nRBC 0.0 0.0 - 5.0 %  ANC (Abs Neutrophil Count) 4.13 1.40 - 6.60 x 1000/?L  Absolute Lymphocyte Count 1.17 1.00 - 4.50 x 1000/?L  Monocyte Absolute Count 0.95 0.00 - 1.20 x 1000/?L  Eosinophil Absolute Count 0.16 0.00 - 0.45 x 1000/?L  Basophil Absolute Count 0.06 0.0 - 0.3 x 1000/?L  Absolute Immature Granulocyte Count 0.03 0.00 - 0.10 x 1000/?L ABORh Recheck Needed (LMW)  Collection Time: 11/03/19  4:06 AM Result Value Ref Range  ABORh Recheck Needed YES  Type and Rh recheck     (BH GH LMW YH)  Collection Time: 11/03/19  4:43 AM Result Value Ref Range  ABORH Recheck Interpretation O POS  Diagnostics:No new radiology.ECG/Tele Events: No ECG ordered todayAssessment: Principal Problem:  Acute GI bleeding  SNOMED [place](R): ACUTE GASTROINTESTINAL HEMORRHAGE  Active Problems:  Alcohol dependence (HC Code)  SNOMED [place](R): ALCOHOL DEPENDENCE    Acute renal insufficiency  SNOMED [place](R): ACUTE RENAL INSUFFICIENCY  Plan: ICU admission for this 69 year old woman with a history of alcohol dependence/abuse presents with hematemesis and melena most likely upper GI bleed, possible Mallory-Weiss tear versus alcoholic gastritis versus ulcer.  NPO.  Continue octreotide infusion 50 micrograms/hour.  Pepcid 20 milligrams IV q.12 hours.  Patient was given 1 dose of Protonix 80 milligrams IV.  Type and screen.  Consult Dr. Eustaquio Boyden for GI.  Normal saline at 100 cc/hour.For patient's alcohol dependence CIWA protocol with symptom triggered Ativan injections.  Thiamin 100 milligrams IV daily, folic acid 1 milligram IV until taking p.o..  SCD bilaterally for DVT prophylaxis.  Repeat CBC, BMP.  Repeat hemoglobin/hematocrit at 11:00 a.m.Advanced Care Planning: I confirmed that the patient's Advanced Care Plan is present, code status is documented, or surrogate decision maker is listed in the patient's medical record.Medication reconciliation: I have used all available immediate resources to obtain, update or review the patient's current medications.I have discussed the patients code status:Yeswith:patientNotifications: PCP: Kingsley Plan [phone]    I have personally notified the patient's primary care provider of this admission. Yes  Family was notified of this admission. YesI have personally discussed the plan with the patient and/or family. YesI have reviewed the plan of care with the bedside nurse, including an emphasis on the most important aspects of care for the next 12 hours: yesCritical care time:  30 minutes, greater than 50% of time was spent on counseling/coordination of care.Signed:Savonna Birchmeier Janey Genta, MD 3/17/20216:31 AMAddendum: None

## 2019-11-03 NOTE — Plan of Care
Admission Note Nursing Melinda George is a 69 y.o. female admitted with a chief complaint of vomiting red blood. Patient arrived from  Patient is    Vitals:  11/03/19 0839 11/03/19 0910 11/03/19 1000 11/03/19 1100 BP: (!) 146/76 (S) (!) 100/53 (!) 112/47 (!) 97/45 Pulse: 84 83 84 84 Resp: 16 16 18  (!) 2 Temp:   98.3 ?F (36.8 ?C) 99 ?F (37.2 ?C) TempSrc:   Temporal Temporal SpO2: 95% 95% 94% 94% Weight:   104.4 kg  Height:   5' 6 (1.676 m)  Oxygen therapy Oxygen TherapySpO2: 94 %Device (Oxygen Therapy): room airCheck Ambulating SpO2?: noI have reviewed the patient's current medication orders.Current Facility-Administered Medications Medication Dose Route Frequency Provider Last Rate Last Admin  [START ON 11/06/2019] folic acid (FOLVITE) tablet 1 mg  1 mg Oral Daily Main, Marnee Spring, MD      folic acid syringe 1 mg  1 mg IV Push Daily Main, Marnee Spring, MD      ondansetron (PF) (ZOFRAN) injection 4 mg  4 mg IV Push Once Nicholes Stairs, MD      pantoprazole (PROTONIX) 40 mg in sodium chloride 0.9% PF 10 mL (4 mg/mL)  40 mg IV Push Q12H Elshazly, Ahmed, MD      sodium chloride 0.9 % flush 3 mL  3 mL IV Push Q8H Main, Marnee Spring, MD      thiamine (VITAMIN B1) injection 100 mg  100 mg IV Push Daily Main, Marnee Spring, MD   100 mg at 11/03/19 0431  [START ON 11/06/2019] thiamine (VITAMIN B1) tablet 100 mg  100 mg Oral Daily Main, Marnee Spring, MD       midazolam (VERSED) 100 mg in 100 mL (1 mg/mL) infusion    octreotide (SandoSTATIN) 600 mcg in sodium chloride 0.9 % 240 mL (2.5 mcg/mL) infusion 50 mcg/hr (11/03/19 0452)  sodium chloride 100 mL/hr (11/03/19 1139) Initiate YAWP Protocol **AND** Notify Physician/LIP/Team **AND** midazolam (PF) **AND** midazolam (PF) **AND** midazolam (PF) **AND** midazolam **AND** midazolam (VERSED) 100 mg in 100 mL (1 mg/mL) infusion, ondansetron, sodium chlorideCurrent Facility-Administered Medications Medication Dose Route Frequency Last Rate  [START ON 11/06/2019] folic acid  1 mg Oral Daily    folic acid  1 mg IV Push Daily    midazolam (PF)  5 mg IV Push Q30 MIN PRN    And  midazolam (PF)  7.5 mg IV Push Q30 MIN PRN    And  midazolam (PF)  10 mg IV Push Q15 MIN PRN    And  midazolam  5 mg Intravenous Q30 MIN PRN    And  midazolam (VERSED) 100 mg in 100 mL (1 mg/mL) infusion   Intravenous Continuous PRN    octreotide (SandoSTATIN) 600 mcg in sodium chloride 0.9 % 240 mL (2.5 mcg/mL) infusion  50 mcg/hr Intravenous Continuous 50 mcg/hr (11/03/19 0452)  ondansetron (PF)  4 mg IV Push Once    ondansetron  4 mg Oral Q6H PRN    pantoprazole (PROTONIX) IV PUSH 40 mg injection  40 mg IV Push Q12H    sodium chloride  3 mL IV Push Q8H    sodium chloride  3 mL IV Push PRN for Line Care    sodium chloride  100 mL/hr Intravenous Continuous 100 mL/hr (11/03/19 1139)  thiamine  100 mg IV Push Daily    [START ON 11/06/2019] thiamine  100 mg Oral Daily   See flowsheets, patient education and plan of care for additional information. Plan of Care Overview/ Patient  Status    Pt arrived at 0953 via stretcher from the ER. Pt denies nausea at time. No vomiting blood and no bloody stools. Moderate tremor noted. Octreotide infusing via PIV. Pt given 1 Liter NS Bolus. Dr Margaretmary Eddy in to speak with patient. POC is to Continue Octreotide infusion. Remain NPO. Monitor labs, vital signs.

## 2019-11-03 NOTE — Plan of Care
Problem: Adult Inpatient Plan of CareGoal: Readiness for Transition of CareOutcome: Interventions implemented as appropriate Plan of Care Overview/ Patient Status    Per H/p  Melinda George is a 69 y.o.  female with past medical history significant for hypertension, dyslipidemia, essential tremor, class 2 obesity, generalized anxiety disorder, depression, and alcohol dependence and abuse who has been actively drinking since with patient  and husband at bedside. Explained the role of Case ManagementPatient lives with  her husband; she reports that she is independent in her care; She does admti to daily ETOH use; but says she is able to ambulate with out assisitive device does have a walking stick  her bed room is upstairs,  her husband Melinda George does the cooking and shopping.   Patients PCP is   Rosary Lively at [facility]  she has seen   him recently within 6 monthsPatient  does drive.Patient manages her  own medication. Patient designated her  husband as a care giverDischarge plan discussed with  patient and her husband;  we will need to follow up at dc,  she does not anticipate any needs on dc They are looking forward to seeing social work  to discuss alcohol  abuse resources  Social work referral entered. They are presently working with a virtual substance abuse company out of [place] called [employer]atient has transportation at discharge from her husbandThis Case manager advised patient  and family that case management would continue to follow and re assess discharge needs as reviewed in transitional rounds and by follow up with patient as needed. Fabian November RN BSN CCMPhone [phone] [place] [phone] [facility]  CM Assessment and Discharge Planning    Most Recent Value Case Management Assessment Do you have a caregiver?  Yes Permission to Speak to Caregiver?  Yes Caregiver name:  her husband helps her Melinda George Caregiver phone number:  [phone] Patient Requires Care Coordination Intervention Due To  ability to make medical decisions in question Arrived from prior to admission  home Services Prior to Admission  none Type of Home Care Services  None What equipment do you currently use at home?  none [Patient has Wheelchair, Cane walking stick  but there are not here] Documented Insurance Accurate  Yes Any financial concerns related to anticipated discharg needs  No Patient's home address verified  Yes Patient's PCP of record verified  Yes Last Date Seen by PCP  0-3 months Living Environment  Lives With  Spouse Current Living Arrangements  home/apartment/condo Home Accessibility  no concerns Transportation Available  family or friend will provide Home Safety Feels Safe Living In Home  yes Source of Clinical History Patient's clinical history has been reviewed and source of Information is:  Patient, Spouse Designated Discharge Caregiver 1 Name designated caregiver  Melinda George

## 2019-11-03 NOTE — Other
Operative Diagnosis:Pre-op:   * No pre-op diagnosis entered * Patient Coded Diagnosis   Pre-op diagnosis: Gastrointestinal hemorrhage, unspecified gastrointestinal hemorrhage type  Post-op diagnosis: Gastrointestinal hemorrhage, unspecified gastrointestinal hemorrhage type  Patient Diagnosis   None    Post-op diagnosis:   * Gastrointestinal hemorrhage, unspecified gastrointestinal hemorrhage type [K92.2]Operative Procedure(s) :Procedure(s) (LRB):UPPER GI ENDOSCOPY; W/BX, SINGLE/MULTIPLE (N/A)Post-op Procedure & Diagnosis ConfirmationPost-op Diagnosis: Post-op Diagnosis confirmed (no changes)Post-op Procedure: Post-op Procedure confirmed (no changes)

## 2019-11-03 NOTE — ED Notes
9:32 AM lactic acid 5.2

## 2019-11-03 NOTE — Anesthesia Pre-Procedure Evaluation
This is a 69 y.o. female scheduled for UPPER GI ENDOSCOPY; W/BX, SINGLE/MULTIPLE (N/A ).Review of Systems/ Medical HistoryPatient summary, nursing notes, pre-procedure vitals, height, weight and NPO status reviewed.No previous anesthesia concernsAnesthesia Evaluation:   No history of anesthetic complications  Estimated body mass index is 37.15 kg/m? as calculated from the following:  Height as of this encounter: 5' 6 (1.676 m).  Weight as of this encounter: 104.4 kg. CC/HPI: Past Medical History04/28/2013: Alcohol abuse, in remissionNo date: Alcohol dependence (HC Code)06/02/2013: Body mass index 39.0-39.9, adult05/12/2009: Depressive disorder05/12/2009: Essential hypertension    Comment:  CONT CURRENT MEDS, CR AUG 2014 0.901/11/2010: Essential tremor05/12/2009: Gastroesophageal reflux disease05/12/2009: Generalized anxiety disorder05/12/2009: Hyperlipidemia10/05/2013: Obesity05/12/2009: Rosacea09/07/2010: Sprain of anklePast Surgical History:  Past Surgical History:12/12/2010: COLONOSCOPY    Comment:  DONE AT [facility] DIG[facility]IVE DISEASES DR Daphene [facility] RECALL              IN 5 YEARSNo date: HYSTERECTOMY01/08/1992: LUMBAR DISC SURGERY    Comment:  LUMBAR01/08/1995: TOTAL ABDOMINAL HYSTERECTOMY W/ BILATERAL SALPINGOOPHORECTOMY10/2015: TOTAL ELBOW REPLACEMENT    Comment:  [email or web address] has a history of: hypertension. Neuromuscular: -Comments: Essential tremor Gastrointestinal/Genitourinary: -Gastrointestinal Disorders:  Patient has GERD.-Nutritional Disorders: Patient has has increased body weightPhysical ExamCardiovascular:  murmurRhythm: regularPulmonary:  normal exam  Airway:  Mallampati: IIITM distance: <3 FBNeck ROM: fullDental:  normal exam  Other Findings: Tachycardic. Significant tremor noted. Mild anisocoria right pupil greater than left.  Anesthesia PlanASA 4 - emergent The primary anesthesia plan is  general ETT. Plan discussed with patient and ICU attending. Patient will have a general anesthetic in ICU for the endoscopy and will remain intubated after the procedure. Perioperative Code Status confirmed: It is my understanding that the patient is currently designated as 'Full Code' and will remain so throughout the perioperative period.Anesthesia informed consent obtained. Anesthesia written consent obtainedConsent obtained from: patientThe post operative pain plan is per surgeon management.Anesthesiologist/CRNA's Pre Op NoteI personally evaluated and examined the patient prior to the intra-operative phase of care.

## 2019-11-03 NOTE — ED Notes
7:42 AM Assumed care of pt Pt originally to be admitted to ICU and then possibly endoscopy-pt now going to endo first. Pt reswabbed for covid for quick testPt remains on cardiac monitor in NSR. VSS. Octreotide running at 50 mcg/hr. CIWA 12- pt reporting anxiety- will medicate shortly8:22 AMPt oob to bathroom for urine. Pt with large BM of bright red blood and clots. Pt with large amou8:29 AMPt remains on cardiac monitorhospitalist aware of new blood, Phleb paged for repeat cbc at this time8:38 AMDr. Burgess Estelle in room with pt at this timePt medicated per [facility]. 9:23 AMReport given to Lawson Fiscal, RN in ICU. 9:32 AMLactic critical- Dr. Ardeen Garland aware

## 2019-11-03 NOTE — Plan of Care
Plan of Care Overview/ Patient Status    Pt intubated at bedside with Dr Azzie Almas @ 1445. See Anesthesia note. Foley #16 F placed by this RN with JG RN assisting. CXR in progress. Endo at bedside on standby for EGD

## 2019-11-03 NOTE — Progress Notes
[facility]	General Medicine Progress NotePatient Data:  Patient Name: Melinda George Age: 69 y.o. DOB: May 25, 1951	 MRN: [id number]	 Hospital Medicine Progress Cornelious Bryant, MDSubjective: Patient is seen and examined at bedside. Patient is not in acute distress. Seen in the ER in am right after she vomited blood, couple of Tbs. Vitals was stable SBP>130. Seen again in ICU. MEDS:  I have reviewed Medications & Allergy ListCurrent Facility-Administered Medications Medication Dose Route Frequency Provider Last Rate Last Admin ? [START ON 11/06/2019] folic acid (FOLVITE) tablet 1 mg  1 mg Oral Daily Main, Marnee Spring, MD     ? folic acid syringe 1 mg  1 mg IV Push Daily Main, Marnee Spring, MD     ? ondansetron (PF) (ZOFRAN) injection 4 mg  4 mg IV Push Once Nicholes Stairs, MD     ? pantoprazole (PROTONIX) 40 mg in sodium chloride 0.9% PF 10 mL (4 mg/mL)  40 mg IV Push Q12H Nosson Wender, MD     ? sodium chloride 0.9 % flush 3 mL  3 mL IV Push Q8H Main, Marnee Spring, MD     ? thiamine (VITAMIN B1) injection 100 mg  100 mg IV Push Daily Main, Marnee Spring, MD   100 mg at 11/03/19 0431 ? [START ON 11/06/2019] thiamine (VITAMIN B1) tablet 100 mg  100 mg Oral Daily Main, Marnee Spring, MD     .Past Medical History: Diagnosis Date ? Alcohol abuse, in remission 12/15/2011 ? Alcohol dependence (HC Code)  ? Body mass index 39.0-39.9, adult 06/02/2013 ? Depressive disorder 12/21/2009 ? Essential hypertension 12/21/2009  CONT CURRENT MEDS, CR AUG 2014 0.9 ? Essential tremor 08/22/2010 ? Gastroesophageal reflux disease 12/21/2009 ? Generalized anxiety disorder 12/21/2009 ? Hyperlipidemia 12/21/2009 ? Obesity 05/28/2013 ? Rosacea 12/21/2009 ? Sprain of ankle 04/30/2010 Physical Exam:Temp:  [98.3 ?F (36.8 ?C)-99.3 ?F (37.4 ?C)] 99 ?F (37.2 ?C)Pulse:  [78-85] 84Resp:  [2-21] 2BP: (97-170)/(45-91) 97/45SpO2:  [94 %-99 %] 94 %No intake or output data in the 24 hours ending 11/03/19 1125Constitutional: Oriented to person, place, and time. tremorAppears well-developed and well-nourished. HENT: Head: Normocephalic and atraumatic. Eyes: Conjunctivae and EOM are normal. Neck: Normal range of motion. Neck supple. Cardiovascular: Normal rate and regular rhythm. Pulmonary/Chest: Effort normal and breath sounds normal. Abdominal: Soft.  Diminished bowel sounds. No tenderness. No rigidity.  No rebound tenderness. Musculoskeletal: Normal range of motion. Exhibits no deformity. Neurological: Alert and oriented to person, place, and time. Skin: Skin is warm and dry. Psychiatric: Normal mood and affect. Behavior is normal. Data:Recent Labs Lab 03/17/210406 NA 138 K 4.8 CL 103 CO2 22 BUN 19* CREATININE 1.18* GLU 121* CALCIUM 8.8  Recent Labs Lab 03/17/210406 ALBUMIN 3.0* PROT 6.5 BILITOT 0.4 ALKPHOS 96 ALT 75 AST 82* Recent Labs Lab 03/17/210406 03/17/210851 WBC 6.50 5.81 HGB 12.1 10.9* HCT 36.4 33.7* PLT 196 184 Recent Labs Lab 03/17/210406 INR 1.0 No results for input(s): LABBLOO in the last 168 hours.No results for input(s): LABURIN in the last 168 hours.No results found for this or any previous visit (from the past 1008 hour(s)).I have reviewed patients Radiology images/reports.  Assessment/Plan:GI bleed. Likely upperBloody stool PR, likely from upperPossible esophogeal varices given alcoholismCross match. Anemia panelhgb still stableGI planning for EGD, initially patient was going from the ER to the endoscopy, then GI preferred to do the endoscopy in the ICU. S/p 80 protonix iv in the  ERoctreotid dripSevere Lactic acidosisLactic acid 5, likely dehydration and patient can have high lactate from possible liver injury  from drinkingGave 1L bolus, then will trendBlood culture Alcohol disorderMonitor for withrawal. As patient drinks 1.5 L of white wine dailyThiamine and folic acidModerate/severe tremor2/2 essential tremorWith possible underlying withdrawal as well.Mild AKILikely prerenal DVT prophylaxis: SCDCare/plan discussed with patient and bedside nurse, intensivist and GI.I have confirmed that the patient's advanced care plan is present or surrogate decision maker is listed in the patient's medical record.I have utilized all available immediate resources to obtain, update, or review the patient's current medications.Full Code/ACLSLength of visit 45 minutes, with >50% spent counseling and coordinating care.Discharge Plan:  >48h.Signed:Katanya Schlie Burgess Estelle, MD3/17/202111:25Discussed with intensivist. GI will do endoscopy at bedside. intensivist recommended intubation for the procedure and keep the patient intubated for alcohol withdrawal. Repeat lactate was 4. Another IVF bolus was ordered.I was in the room during the endoscopy procedure. Ulcer was found but non bleeding. Plan for colonoscopy tomorrowUA: showing possible UTI. So started rocephin.  Repeat lactate normal.

## 2019-11-03 NOTE — Other
Post Anesthesia Transfer of Care NotePatient: Melinda B BrengleProcedure(s) Performed: Procedure(s) (LRB):UPPER GI ENDOSCOPY; W/BX, SINGLE/MULTIPLE (N/A)Patient location: ICULast Vitals: Vital signs stable prior to leaving bedside. Patient remained intubated and ventilated as per plan. Level of consciousness: sedatedTransport Vital Signs:  Stable since the last set of recorded intra-operative vital signsComplications: noneIntra-operative Intake & Output and Antibiotics as per Anesthesia record and discussed with the RN.

## 2019-11-03 NOTE — ED Notes
7:34 AM Report given to [facility] RN.

## 2019-11-03 NOTE — ED Provider Notes
HistoryChief Complaint Patient presents with ? Emesis   Pt states she woke up 30 minutes ago and began to vomit bright red blood.  Pt states she drinks about a liter and a half of wine daily.  Denies SOB and CP.  Pt denies being on blood thinners.   Patient states that she woke up and was vomiting blood this morning.  She has history of having alcoholism.  She has no fever chills or any other associated signs or symptoms.  Patient states that she has not had any dark stools.The history is provided by the patient. EmesisSeverity:  ModerateTiming:  IntermittentQuality:  Bright red blood (With clots)Progression:  UnchangedChronicity:  NewRelieved by:  NothingWorsened by:  NothingIneffective treatments:  None triedAssociated symptoms: no abdominal pain, no diarrhea, no fever, no headaches, no myalgias and no sore throat  Risk factors: alcohol use   Past Medical History: Diagnosis Date ? Alcohol abuse, in remission 12/15/2011 ? Body mass index 39.0-39.9, adult 06/02/2013 ? Depressive disorder 12/21/2009 ? Essential hypertension 12/21/2009  CONT CURRENT MEDS, CR AUG 2014 0.9 ? Essential tremor 08/22/2010 ? Gastroesophageal reflux disease 12/21/2009 ? Generalized anxiety disorder 12/21/2009 ? Hyperlipidemia 12/21/2009 ? Obesity 05/28/2013 ? Rosacea 12/21/2009 ? Sprain of ankle 04/30/2010 Past Surgical History: Procedure Laterality Date ? COLONOSCOPY  12/12/2010  DONE AT [facility] RECALL IN 5 YEARS ? HYSTERECTOMY   ? LUMBAR DISC SURGERY  08/19/1992  LUMBAR ? TOTAL ABDOMINAL HYSTERECTOMY W/ BILATERAL SALPINGOOPHORECTOMY  08/20/1995 ? TOTAL ELBOW REPLACEMENT  05/2014  [email or web address] Family History Problem Relation Age of Onset ? Alcohol abuse Other       self ? ADHD Other  ? Depression Other  ? Endometriosis Other       self ? Heart disease Other       grandmother ? Hypertension Other       grandmother ? Osteoporosis Other  ? Stroke Other  ? Thyroid disease Other  ? Preeclampsia Other  ? Breast cancer Maternal Aunt  ? ADHD Brother  ? Heart disease Father  ? Osteoporosis Maternal Grandmother  ? Stroke Maternal Grandmother  ? Thyroid disease Sister  ? Preeclampsia Sister  ? Breast cancer Paternal Aunt  Social History Socioeconomic History ? Marital status: Married   Spouse name: Not on file ? Number of children: Not on file ? Years of education: Not on file ? Highest education level: Not on file Tobacco Use ? Smoking status: Former Smoker   Packs/day: 1.00   Years: 10.00   Pack years: 10.00   Types: Cigarettes ? Smokeless tobacco: Never Used Substance and Sexual Activity ? Alcohol use: Yes   Alcohol/week: 8.0 standard drinks   Types: 8 Glasses of wine per week   Frequency: 4 or more times a week   Drinks per session: 3 or 4   Binge frequency: Daily or almost daily   Comment: Did AA/ and is currently in [facility] ? Drug use: No ? Sexual activity: Yes   Partners: Male ED Other Social History E-cigarette/Vaping Substances E-cigarette/Vaping Devices Review of Systems Constitutional: Negative for fever. HENT: Negative for sore throat.  Respiratory: Negative for chest tightness and shortness of breath.  Cardiovascular: Negative for chest pain. Gastrointestinal: Positive for vomiting. Negative for abdominal pain and diarrhea. Musculoskeletal: Negative for myalgias. Neurological: Negative for headaches. All other systems reviewed and are negative. Physical ExamED Triage Vitals [11/03/19 0335]BP: (!) 163/91Pulse: 85Pulse from  O2 sat: n/aResp: 17Temp: 99.3 ?F (37.4 ?C)Temp src: OralSpO2: 96 % BP 126/63  -  Pulse 78  - Temp 99.3 ?F (37.4 ?C) (Oral)  - Resp (!) 21  - Ht 5' 6 (1.676 m)  - Wt 104.9 kg  - SpO2 95%  - BMI 37.33 kg/m? Physical ExamVitals signs and nursing note reviewed. Constitutional:     Appearance: Normal appearance. HENT:    Head: Normocephalic. Eyes:    Pupils: Pupils are equal, round, and reactive to light. Neck:    Musculoskeletal: Normal range of motion. Cardiovascular:    Rate and Rhythm: Normal rate and regular rhythm.    Heart sounds: Normal heart sounds. Pulmonary:    Effort: Pulmonary effort is normal.    Breath sounds: Normal breath sounds. Abdominal:    General: Bowel sounds are normal.    Palpations: Abdomen is soft.    Tenderness: There is no abdominal tenderness. Skin:   General: Skin is warm. Neurological:    General: No focal deficit present.    Mental Status: She is alert.  ProceduresProcedures ED COURSEPatient Reevaluation: Patient presents with slight tremor.  She states that she had her last drink last night.  As patient is unknown whether she has varices are not, will start octreotide as well as Protonix.  Protonix drip is not available at this time.  Discussed with GI as well as hospitalist for admission.Results for orders placed or performed during the hospital encounter of 11/03/19-Protime and INR     Result                                            Value                         Ref Range                     Prothrombin Time                                  10.7                          9.9 - 11.8 seconds            INR                                               1.0                           0.9 - 1.1                -Hepatic function panel     Result                                            Value                         Ref Range                     Alkaline Phosphatase  96                            45 - 117 U/L                  Alanine Aminotransferase (ALT)                    75                            12 - 78 U/L                   Aspartate Aminotransferase (AST) 82 (H)                        15 - 37 U/L                   Total Protein                                     6.5                           6.4 - 8.2 g/dL                Albumin                                           3.0 (L)                       3.4 - 5.0 g/dL                Globulin                                          3.5                           2.5 - 5.0 g/dL                Bilirubin, Direct                                 <0.10                         0.00 - 0.20 mg/dL             Total Bilirubin                                   0.4                           0.2 - 1.0 mg/dL          -Ethanol     Result  Value                         Ref Range                     Ethanol Level                                     0.07 (H)                      <0.01 g/dL               -Basic metabolic panel     Result                                            Value                         Ref Range                     Glucose                                           121 (H)                       65 - 110 mg/dL                BUN                                               19 (H)                        7 - 18 mg/dL                  Creatinine                                        1.18 (H)                      0.55 - 1.02 mg/dL             eGFR (AFRICAN-AMERICAN)                           55                            >60 mL/min/1.39m2             eGFR (NON African-American)                       45                            >  60 mL/min/1.31m2             Sodium                                            138                           136 - 145 mmol/L              Potassium                                         4.8                           3.5 - 5.1 mmol/L              Chloride                                          103                           98 - 107 mmol/L               CO2                                               22 21 - 32 mmol/L                Anion Gap                                         13                            5 - 15 mmol/L                 Calcium                                           8.8                           8.5 - 10.1 mg/dL         -CBC auto differential     Result                                            Value                         Ref Range  WBC                                               6.50                          4.00 - 10.00 x1000/?L         RBC                                               3.81 (L)                      4.00 - 5.20 M/?L              Hemoglobin                                        12.1                          11.0 - 15.0 g/dL              Hematocrit                                        36.4                          34.0 - 45.0 %                 MCV                                               95.5                          79.0 - 99.0 fL                MCH                                               31.8                          27 - 33 pg                    MCHC                                              33.2  32.0 - 36.0 g/dL              RDW-CV                                            13.0                          11.5 - 14.5 %                 Platelets                                         196                           140 - 400 x1000/?L            MPV                                               8.9                           7.5 - 11.5 fL                 Neutrophils                                       63.5                          36.0 - 66.0 %                 Lymphocytes                                       18.0 (L)                      25.0 - 45.0 %                 Monocytes                                         14.6 (H)                      0.0 - 12.0 %                  Eosinophils                                       2.5 0.0 - 4.0 %                   Basophil  0.9                           0.0 - 3.0 %                   Immature Granulocytes                             0.5                           0.0 - 1.0 %                   nRBC                                              0.0                           0.0 - 5.0 %                   ANC (Abs Neutrophil Count)                        4.13                          1.40 - 6.60 x 1000/?L         Absolute Lymphocyte Count                         1.17                          1.00 - 4.50 x 1000/?L         Monocyte Absolute Count                           0.95                          0.00 - 1.20 x 1000/?L         Eosinophil Absolute Count                         0.16                          0.00 - 0.45 x 1000/?L         Basophil Absolute Count                           0.06                          0.0 - 0.3 x 1000/?L           Absolute Immature Granulocyte Count               0.03  0.00 - 0.10 x 1000/?L    -Type and screen     Result                                            Value                         Ref Range                     ABO Grouping                                      O                                                           Rh Type                                           POS                                                         Antibody Screen                                   NEG                                                    -ABORh Recheck Needed (LMW)     Result                                            Value                         Ref Range                     ABORh Recheck Needed                              YES                                                    -Type and Rh recheck     (BH GH LMW YH)     Result  Value                         Ref Range                     ABORH Recheck Interpretation O POS                                                  No results found.Clinical Impressions as of Nov 02 548 Acute GI bleeding  ED DispositionAdmit Theodoro Grist, MD03/17/21 1610

## 2019-11-03 NOTE — Utilization Review (ED)
UM Status: Commercial - IP

## 2019-11-03 NOTE — Consults
[facility] [employer] [facility]	 Pulmonary Critical Care ICU Initial ConsultPatient's name:Melinda George; 69 y.o.[id number]; 1952-04-19ER date: 3/17/2021Admit date: 11/03/2019 Hospital Day: 0Today's date: 11/03/2019 Presentation History: HPI: Melinda George is a 69 y.o. female with history of on history of alcohol dependence currently in a long-term program involving his breathalyzer, therapist and psychiatrist.  She will cover approximately 1:00 a.m. on the morning of admission and was nauseous and retching and had bright red blood per rectum.  She has been drinking up to 1.5 liters wine per day.  Her alcohol level was 0.07 on arrival.  Hemoglobin was stable at 12 and then drop to 10Transferred to ICU/CCU on 03/17/2020Review of Allergies/Medical History/Medications: PMH PSH Past Medical History: Diagnosis Date ? Alcohol abuse, in remission 12/15/2011 ? Alcohol dependence (HC Code)  ? Body mass index 39.0-39.9, adult 06/02/2013 ? Depressive disorder 12/21/2009 ? Essential hypertension 12/21/2009  CONT CURRENT MEDS, CR AUG 2014 0.9 ? Essential tremor 08/22/2010 ? Gastroesophageal reflux disease 12/21/2009 ? Generalized anxiety disorder 12/21/2009 ? Hyperlipidemia 12/21/2009 ? Obesity 05/28/2013 ? Rosacea 12/21/2009 ? Sprain of ankle 04/30/2010  Past Surgical History: Procedure Laterality Date ? COLONOSCOPY  12/12/2010  DONE AT [facility] RECALL IN 5 YEARS ? HYSTERECTOMY   ? LUMBAR DISC SURGERY  08/19/1992  LUMBAR ? TOTAL ABDOMINAL HYSTERECTOMY W/ BILATERAL SALPINGOOPHORECTOMY  08/20/1995 ? TOTAL ELBOW REPLACEMENT  05/2014  [email or web address]  Family History   Social History family history includes ADHD in her brother and another family member; Alcohol abuse in an other family member; Breast cancer in her maternal aunt and paternal aunt; Depression in an other family member; Endometriosis in an other family member; Heart disease in her father and another family member; Hypertension in an other family member; Osteoporosis in her maternal grandmother and another family member; Preeclampsia in her sister and another family member; Stroke in her maternal grandmother and another family member; Thyroid disease in her sister and another family member.  Heme history reviewed at bedside.  No new pertinent to add  reports that she has quit smoking. Her smoking use included cigarettes. She has a 10.00 pack-year smoking history. She has never used smokeless tobacco. She reports current alcohol use of about 84.0 standard drinks of alcohol per week. She reports that she does not use drugs. @HMED @I  have utilized all available immediate resources to obtain, update, or review the patient's current medications. Current Medications:AllergiesAllergies Allergen Reactions ? Lanolin  ? Nickel  ? Lactose (Bulk) Rash Scheduled:Current Facility-Administered Medications Medication Dose Route Frequency Provider Last Rate Last Admin ? [START ON 11/06/2019] folic acid (FOLVITE) tablet 1 mg  1 mg Oral Daily Main, Marnee Spring, MD     ? folic acid syringe 1 mg  1 mg IV Push Daily Main, Marnee Spring, MD     ? ondansetron (PF) (ZOFRAN) injection 4 mg  4 mg IV Push Once Nicholes Stairs, MD     ? pantoprazole (PROTONIX) 40 mg in sodium chloride 0.9% PF 10 mL (4 mg/mL)  40 mg IV Push Q12H Elshazly, Ahmed, MD     ? sodium chloride 0.9 % flush 3 mL  3 mL IV Push Q8H Main, Marnee Spring, MD     ? thiamine (VITAMIN B1) injection 100 mg  100 mg IV Push Daily Main, Marnee Spring, MD   100 mg at 11/03/19 0431 ? [START ON 11/06/2019] thiamine (VITAMIN B1) tablet 100 mg  100 mg Oral Daily Main, Marnee Spring, MD Infusions:?  octreotide (SandoSTATIN) 600 mcg in sodium chloride 0.9 % 240 mL (2.5 mcg/mL) infusion 50 mcg/hr (11/03/19 0452) ? sodium chloride   PRN Meds:LORazepam, LORazepam, ondansetron, sodium chlorideReview of Systems: Pertinent positives discussed in HPI.  Otherwise a 14 point review of system was negative full detail.Physical Exam: Vital Signs:Most recent: Patient Vitals for the past 24 hrs: BP Temp Temp src Pulse Resp SpO2 Height Weight 11/03/19 0910 (!) 100/53 ? ? 83 16 95 % ? ? 11/03/19 0839 (!) 146/76 ? ? 84 16 95 % ? ? 11/03/19 0824 137/76 ? ? 85 16 95 % ? ? 11/03/19 0715 (!) 170/66 ? ? 81 16 96 % ? ? 11/03/19 0621 (!) 145/59 ? ? 79 20 95 % ? ? 11/03/19 0549 126/63 ? ? 78 (!) 21 95 % ? ? 11/03/19 0429 110/82 ? ? 80 18 99 % ? ? 11/03/19 0335 (!) 163/91 99.3 ?F (37.4 ?C) Oral 85 17 96 % 5' 6 (1.676 m) 104.9 kg O2 Sat and Device used: SpO2: 95 %Device (Oxygen Therapy): room airVitals Min & Max: BP  Min: 100/53  Max: 170/66Temp  Avg: 99.3 ?F (37.4 ?C)  Min: 99.3 ?F (37.4 ?C)  Max: 99.3 ?F (37.4 ?C)Pulse  Avg: 81.9  Min: 78  Max: 85Resp  Avg: 17.5  Min: 16  Max: 21SpO2  Avg: 95.8 %  Min: 95 %  Max: 99 %Height  Avg: 5' 6 (167.6 cm)  Min: 5' 6 (167.6 cm)  Max: 5' 6 (167.6 cm)Weight  Avg: 104.9 kg  Min: 104.9 kg  Max: 104.9 kgIntake/Output:No intake or output data in the 24 hours ending 11/03/19 1041Weights:Wt: 11/03/19 104.9 kg 09/15/18 104.8 kg 04/14/18 101.6 kg Vent Settings/oxygen settings:   Physical Examination: General Alert and interactive, comfortable appearing   HEENT Sclera anciteric, Moist membranes.Neck:  Trachea midline.ETT: Yes []  No []  Pupil size  3 mm Respiratory  Coarse breath sounds with scattered rhonchi Extremity  Trace edema. Pulses 2+ bilaterally. Cardiovascular Regular without murmurs, gallops, or heave Neuro No focal deficitsFollows commands? Yes []  No []  GI/GU Soft, non-tender, normoactive bowel soundsG tube: Yes []  No [] Foley: Yes []  No []  Skin No rashes     Lines/Tubes: Patient Lines/Drains/Airways Status  Active PICC Line / CVC Line / PIV Line / Drain / ART Line / NG/OG Tube / Catheter   Name:   Placement date:   Placement time:   Site:   Days:  Periph IV-single(adult) 11/03/19 0401 median cubital(antecubital fossa), right 20 gauge Nurse   11/03/19    0401    median cubital(antecubital fossa), right   less than 1    Review of Labs/Diagnostics: Lab Review:I personally have reviewed hold all the patient's morning labsLaboratory Data: Labs Heme       - CBCRecent Labs Lab 03/17/210406 03/17/210851 WBC 6.50 5.81 HGB 12.1 10.9* HCT 36.4 33.7* PLT 196 184  Heme         - CoagsRecent Labs Lab 03/17/210406 INR 1.0  Renal Recent Labs Lab 03/17/210406 NA 138 K 4.8 CL 103 CO2 22 BUN 19* CREATININE 1.18* CALCIUM 8.8  GI/GU       - LFTsRecent Labs Lab 03/17/210406 ALT 75 AST 82* ALKPHOS 96 BILITOT 0.4 BILIDIR <0.10     - LIPASENo results for input(s): LIPASE in the last 168 hours.      - Recent Labs Lab 03/17/210851 AMMONIA 31  CV    - Cardiac EnzymesNo results for input(s): TROPONINI, CKTOTAL, CKMB in the last 168 hours.Invalid input(s): TROPONINPOC  pBNPNo components found for: PROBNP Endocrine   - No results for input(s): TSH in the last 168 hours.     - No results found for: FREET4  Endocrine    - Recent Labs Lab 03/17/210406 GLU 121*  Endocrine -  No results found for: HGBA1C   ID  No results for input(s): LDH, DDIMER, HSCRP, PROCALCITON, FERRITIN, TROPONINT, FIBRINOGEN, CKTOTAL, BNPPRO in the last 168 hours.Invalid input(s):  CRP Pending Labs: Pending Lab Results   Order Current Status  Ferritin In process     ABG Data: No results for input(s): PHART, PCO2ART, PO2ART, HCO3ART, O2SATART, LITERFLOW in the last 168 hours.Respiratory Culture:Lab Results Component Value Date  SARSCOV2 Negative 11/03/2019 Microbiology:No results found for this or any previous visit.Urine cultures:No results found for this or any previous visit.Imaging Review:Serial CXR reviewed while in ICU/CCUI personally reviewed the patient's recent imaging to include Most up-to-date imaging as of 11/03/19 :2/282/019 negativeCT scans:noneEKGs:No results found for this or any previous visit. CLINICAL IMPRESSION  HANNAHMARIE ASBERRY is a 69 y.o.female with probable upper GI bleed   ASSESSMENT AND PLAN 1.	Cardiovascular: ?	Hemodynamically stable not on pressors2. Pulmonary:?	Seems to be protecting her airway?	Will keep her intubated if she is going to get scoped today?	I personally reviewed the patient's recent imaging to include  [id number]. Neurological:        ?	High risk for alcohol withdrawal?	Thiamine/folate supplementation?	Minimize benzos, frequent orientation, minimize lines and tubes, optimize sleep-wake cycles. ?	Minds protocol while intubated4. Gastrointestinal:?	Probable upper GI bleed?	Question variceal bleed?	 octreotide drip5. Renal: ?	Ceftriaxone7. FEN: ?	K: 4.8, goal > 4?	Mg:  Pending, goal > 2?	Phosphorous:  Pending?	Diet:  NPO?	I personally have reviewed hold all the patient's morning labs8. Heme/Onc: ?	Hemoglobin decreased from 13.8 to 10.99. Endo: ?	Blood sugars normal at 118 to 121?	Procedural recommendations at this time (if any):  EGDCRITICAL HOME MEDICATIONS ON HOLD NaltrexoneNebivololI have reviewed the patient's problem list and updated it as needed.Prophylaxis: DVT Prophylaxis: SCDsPUD Prophylaxis: protonixICU ppx: Seizure precautionsDispo/Social: Patient to remain in the MICU.Family and patient updated patient updated at bedsideSurrogate decision maker: Primary Emergency Contact: Wickersham,George, Home Phone: [phone] confirmed that the patient's advanced care plan is present, code status is documented, or surrogate decisionmaker is listed in the patient's medical record. CODE status: Full Code/ACLS Primary Emergency Contact: Curtner,George, Home Phone: [phone] care statement: The patient has required 60 minutes of my undivided attention during the past 24 hours for one of the previously noted life threatening diagnoses. Leone Payor, MDElectronically Signed by Myrene Buddy Atonya Templer, MD3/17/202110:41 AM

## 2019-11-03 NOTE — Plan of Care
Plan of Care Overview/ Patient Status    Pt intubated without incident, 7.5/23cm at lower lip.  VC+16/500/100%/5P. Will continue to monitor.

## 2019-11-03 NOTE — Other
ADMISSION MEDICATION RECONCILIATIONPharmacist review of the medication history obtained by the pharmacy technician has been performed. After reviewing the home medication list in the admission medication reconciliation module, I have identified the following: Sources:PatientDispense reportMedications Flagged for Removal:Kenalog creamCrestorAdjusted Medications:NoneAdded Medications:NaltrexoneNotes:Confirmed med hx with ptNKDAYeds flu shot 1st covid vaccDerek Bennye Alm, CPHT3/17/20217:49 AMIssues:  None Thank you,Wayman Hoard Nathanial Rancher, PharmD7:53 AM3/17/2021Phone: [phone] Signed by Geoffry Paradise, PharmD, March 17, 2021Outpatient Medications Marked as Taking for the 11/03/19 encounter [facility] Encounter) Medication Sig Dispense Refill  escitalopram oxalate (LEXAPRO) 20 MG tablet TAKE 1 TABLET BY MOUTH EVERY DAY 90 tablet 1  gabapentin (NEURONTIN) 300 MG capsule Take 300 mg by mouth 2 (two) times daily with breakfast and dinner. TAKE 1 OR 2 CAPSULES AT BEDTIME  0  naltrexone (REVIA) 50 mg tablet Take 50 mg by mouth 2 (two) times daily.    nebivoloL (BYSTOLIC) 20 mg tablet Take 1 tablet (20 mg total) by mouth daily. 30 tablet 6

## 2019-11-03 NOTE — Other
Pt post op vss and reported off to icu nurse to complete recovery.

## 2019-11-03 NOTE — Progress Notes
Patient was intubated and subsequently underwent urgent EGDBrief findingsEsophagogastroduodenoscopy with biopsy1. Normal appearing small bowel D1 to D3/D4 no evidence of coffee-grounds no AVMs no evidence of source of GI bleeding.  Major papilla was identified no evidence of blood2. Normal pylorus3. Nonbleeding crater gastric ulcers with no stigmata or bleeding were found in the gastric antrum and pre-pyloric region of the stomach.  The largest was  5 millimeters in diameter ER but deeply crater did biopsies were taken there was no evidence of hematin spot visible vessel or recent bleeding.  There were other ulcers in the antrum and pre-pyloric region ranging from 2 to 5 millimeters but not as significantly crated.  For for documentation of the larger ulcer was noted.  Biopsies were taken4. There were also gastric body ulcers with erosions and some heme but no evidence of significant source of bleeding.5. Diffuse gastritis is noted with congestion erythema biopsy the gastric body were taken6. There was heme in the form of coffee grounds in the fundus region of the gastric lumen but again no evidence of source of this blood.7. Excellent visit is a shin of the GE junction on the gastric side without evidence of Mallory-Weiss tear8. Squamocolumnar junction was located at 38 centimeters with distal esophageal ulceration 0 to 1+ esophageal varices without evidence of source of GI bleeding.  The varices were really inconsequential photo documentation is obtained.  There was no hematin spots no cherry-red spot no source of bleeding could be seen in the esophagus the rest of the esophageal mucosa was normal biopsies were takenPatient tolerated procedure wellAt the completion of the procedure and NG tube was placed a chest x-ray is ordered for placement confirmation it was confirmed with insufflation of the gastric lumen.PlanSee my consultation note2. I did speak at length to the patient's husband over 21 minutes of conversation with him regarding findings of the procedure need for alcohol abstinence and further workup to include colonoscopy abdominal ultrasound checking ammonia3. Patient stated that she was using alcohol to control her central tremor because she did not like the side effects of the gabapentin with dry mouth.  Other medications perhaps should be tried.4 patient may be a candidate for medication such as injection Vivitrol 5. Abdominal ultrasound will delineate fatty liver verses beginning of cirrhosis or established cirrhosis patient has been or morbidly obese in drinking for some time certainly placing her at risk for cirrhosis6. Social service input will be helpful7. Patient her husband may be moving to [place] patient will need a follow-up EGD in 4 months to document healing of her ulcer we will be checking for H pylori she will need to have follow-up in [place] if she moves8. Colonoscopy tomorrow prep through NG

## 2019-11-04 ENCOUNTER — Inpatient Hospital Stay: Admit: 2019-11-04 | Payer: PRIVATE HEALTH INSURANCE

## 2019-11-04 DIAGNOSIS — K922 Gastrointestinal hemorrhage, unspecified: Secondary | ICD-10-CM

## 2019-11-04 LAB — COMPREHENSIVE METABOLIC PANEL
BKR ALANINE AMINOTRANSFERASE (ALT): 45 U/L (ref 12–78)
BKR ALBUMIN: 2.6 g/dL — ABNORMAL LOW (ref 3.4–5.0)
BKR ALKALINE PHOSPHATASE: 68 U/L (ref 45–117)
BKR ANION GAP (LM): 9 mmol/L (ref 5–15)
BKR ASPARTATE AMINOTRANSFERASE (AST): 34 U/L (ref 15–37)
BKR BILIRUBIN TOTAL: 0.5 mg/dL (ref 0.2–1.0)
BKR BLOOD UREA NITROGEN: 23 mg/dL — ABNORMAL HIGH (ref 7–18)
BKR CALCIUM: 7.9 mg/dL — ABNORMAL LOW (ref 8.5–10.1)
BKR CHLORIDE: 109 mmol/L — ABNORMAL HIGH (ref 98–107)
BKR CO2: 24 mmol/L (ref 21–32)
BKR CREATININE: 1.34 mg/dL — ABNORMAL HIGH (ref 0.55–1.02)
BKR EGFR (AFR AMER) (LMC): 48 mL/min/{1.73_m2} (ref >60)
BKR EGFR (NON AFR AMER) (LMC): 39 mL/min/{1.73_m2} (ref >60)
BKR GLOBULIN: 2.9 g/dL (ref 2.5–5.0)
BKR GLUCOSE: 138 mg/dL — ABNORMAL HIGH (ref 65–110)
BKR POTASSIUM: 4 mmol/L (ref 3.5–5.1)
BKR PROTEIN TOTAL: 5.5 g/dL — ABNORMAL LOW (ref 6.4–8.2)
BKR SODIUM: 142 mmol/L (ref 136–145)

## 2019-11-04 LAB — ZZZMRSA SURVEILLANCE BY PCR (LMW): BKR MRSA SURVEILLANCE PCR: NEGATIVE

## 2019-11-04 LAB — CBC WITH AUTO DIFFERENTIAL
BKR WAM ABSOLUTE IMMATURE GRANULOCYTES.: 0.03 x 1000/ÂµL (ref 0.00–0.10)
BKR WAM ABSOLUTE LYMPHOCYTE COUNT.: 0.67 x 1000/ÂµL — ABNORMAL LOW (ref 1.00–4.50)
BKR WAM ABSOLUTE NEUTROPHIL COUNT.: 4.67 x 1000/ÂµL (ref 1.40–6.60)
BKR WAM BASOPHIL ABSOLUTE COUNT.: 0.04 x 1000/ÂµL (ref 0.0–0.3)
BKR WAM BASOPHILS: 0.6 % (ref 0.0–3.0)
BKR WAM EOSINOPHIL ABSOLUTE COUNT.: 0.04 x 1000/ÂµL (ref 0.00–0.45)
BKR WAM EOSINOPHILS: 0.6 % (ref 0.0–4.0)
BKR WAM HEMATOCRIT: 26.6 % — ABNORMAL LOW (ref 34.0–45.0)
BKR WAM HEMOGLOBIN: 8.6 g/dL — ABNORMAL LOW (ref 11.0–15.0)
BKR WAM IMMATURE GRANULOCYTES: 0.5 % (ref 0.0–1.0)
BKR WAM LYMPHOCYTES: 10.5 % — ABNORMAL LOW (ref 25.0–45.0)
BKR WAM MCH PG: 31.4 pg (ref 27–33)
BKR WAM MCHC: 32.3 g/dL (ref 32.0–36.0)
BKR WAM MCV: 97.1 fL (ref 79.0–99.0)
BKR WAM MONOCYTE ABSOLUTE COUNT.: 0.93 x 1000/ÂµL (ref 0.00–1.20)
BKR WAM MONOCYTES: 14.6 % — ABNORMAL HIGH (ref 0.0–12.0)
BKR WAM MPV: 9.5 fL (ref 7.5–11.5)
BKR WAM NEUTROPHILS: 73.2 % — ABNORMAL HIGH (ref 36.0–66.0)
BKR WAM NUCLEATED RED BLOOD CELLS: 0 % (ref 0.0–5.0)
BKR WAM PLATELETS: 160 x1000/ÂµL (ref 140–400)
BKR WAM RDW-CV: 13.4 % (ref 11.5–14.5)
BKR WAM RED BLOOD CELL COUNT.: 2.74 M/ÂµL — ABNORMAL LOW (ref 4.00–5.20)
BKR WAM WHITE BLOOD CELL COUNT.: 6.38 x1000/ÂµL (ref 4.00–10.00)

## 2019-11-04 LAB — PHOSPHORUS     (BH GH L LMW YH): BKR PHOSPHORUS: 3.3 mg/dL (ref 2.5–4.9)

## 2019-11-04 LAB — BLOOD GAS, ARTERIAL     (LMW)
BKR AA GRADIENT LM: 157 mmHg
BKR FIO2: 40 %
BKR HCO3, ARTERIAL: 24 mmol/L (ref 22.0–28.0)
BKR PCO2 ARTERIAL: 38 mmHg (ref 35–45)
BKR PH, ARTERIAL: 7.41 U (ref 7.35–7.45)
BKR PO2, ARTERIAL: 81 mmHg (ref 80–100)

## 2019-11-04 LAB — VITAMIN B12: BKR VITAMIN B12: 343 pg/mL (ref 211–911)

## 2019-11-04 LAB — MAGNESIUM: BKR MAGNESIUM: 1.7 mg/dL (ref 1.6–2.6)

## 2019-11-04 LAB — LACTIC ACID, PLASMA: BKR LACTATE: 1.1 mmol/L (ref 0.4–2.0)

## 2019-11-04 MED ORDER — ESCITALOPRAM 10 MG TABLET
10 mg | Freq: Every day | NASOGASTRIC | Status: DC
Start: 2019-11-04 — End: 2019-11-08
  Administered 2019-11-05 – 2019-11-07 (×3): 10 mg via NASOGASTRIC

## 2019-11-04 MED ORDER — MIDAZOLAM (PF) 1 MG/ML INJECTION SOLUTION
1 mg/mL | Status: DC
Start: 2019-11-04 — End: 2019-11-04

## 2019-11-04 MED ORDER — FENTANYL (PF) 50 MCG/ML INJECTION SOLUTION
50 mcg/mL | Status: DC
Start: 2019-11-04 — End: 2019-11-04

## 2019-11-04 MED ORDER — BYSTOLIC 20 MG TABLET
20 | ORAL_TABLET | ORAL | 7 refills | 90.00000 days | Status: AC
Start: 2019-11-04 — End: 2020-07-31

## 2019-11-04 MED ORDER — PROPOFOL 10 MG/ML INTRAVENOUS EMULSION
10 mg/mL | Status: CP
Start: 2019-11-04 — End: ?

## 2019-11-04 MED ORDER — METOPROLOL TARTRATE IMMEDIATE RELEASE 50 MG TABLET
50 mg | Freq: Two times a day (BID) | NASOGASTRIC | Status: DC
Start: 2019-11-04 — End: 2019-11-08
  Administered 2019-11-05 – 2019-11-07 (×6): 50 mg via NASOGASTRIC

## 2019-11-04 NOTE — Other
Operative Diagnosis:Pre-op:   * No pre-op diagnosis entered * Patient Coded Diagnosis   Pre-op diagnosis: Gastrointestinal hemorrhage, unspecified gastrointestinal hemorrhage type  Post-op diagnosis: Gastrointestinal hemorrhage, unspecified gastrointestinal hemorrhage type  Patient Diagnosis   None    Post-op diagnosis:   * Gastrointestinal hemorrhage, unspecified gastrointestinal hemorrhage type [K92.2]Operative Procedure(s) :Procedure(s) (LRB):COLONOSCOPY, FLEXIBLE; W/BX, SINGLE/MULTIPLE (N/A)Post-op Procedure & Diagnosis ConfirmationPost-op Diagnosis: Post-op Diagnosis confirmed (no changes)Post-op Procedure: Post-op Procedure confirmed (no changes)

## 2019-11-04 NOTE — Plan of Care
Problem: Adult Inpatient Plan of CareGoal: Plan of Care ReviewOutcome: Interventions implemented as appropriateGoal: Patient-Specific Goal (Individualized)Outcome: Interventions implemented as appropriateGoal: Absence of Hospital-Acquired Illness or InjuryOutcome: Interventions implemented as appropriateGoal: Optimal Comfort and WellbeingOutcome: Interventions implemented as appropriateGoal: Readiness for Transition of CareOutcome: Interventions implemented as appropriate Problem: InfectionGoal: Infection Symptom ResolutionOutcome: Interventions implemented as appropriate Problem: Alcohol WithdrawalGoal: Alcohol Withdrawal Symptom ControlOutcome: Interventions implemented as appropriate Problem: Skin Injury Risk IncreasedGoal: Skin Health and IntegrityOutcome: Interventions implemented as appropriate Problem: Fall Injury RiskGoal: Absence of Fall and Fall-Related InjuryOutcome: Interventions implemented as appropriate Problem: Restraint, Nonbehavioral (Nonviolent)Goal: Discontinuation Criteria AchievedOutcome: Interventions implemented as appropriateGoal: Personal Dignity and Safety MaintainedOutcome: Interventions implemented as appropriate Plan of Care Overview/ Patient Status    Pt remains intubated, tolerating vent settings. Sedated on versed gtt and prn morphine, RASS -1 to -2, agitated at times but following commands. BM x 2, large, dark red. Continues with bowl prep for scheduled colonoscopy. VSS. Urine output stable.

## 2019-11-04 NOTE — Progress Notes
Patient underwent colonoscopy to the cecum terminal ileum with cold biopsy polypectomy performed in the ICU.Patient received MAC anesthesia as additional supplement to her IV anesthesia drip as the patient is intubatedBrief findingsPerianal exam was normalDigital rectal exam negative for mass lesion sphincter tone was normalNormal colonic mucosa to the cecumNormal terminal ileumA 2 millimeter polyp was found in the sigmoid colon at 60 centimeters sessile removed with cold biopsy forceps with good hemostasisThere were small and large mouth diverticula in the sigmoid and distal descending colon was no evidence of diverticular bleeding there were impacted fecaliths there was no active bleeding seen throughout the colonThere was however red maroon-colored fluid no clot in the sigmoid this was washed and suction there was no red blood in the proximal colon proximal to the splenic flexure the cecum and terminal ileum were clean.  This led me to believe that the bleeding was distal to the splenic flexure and likely diverticularRetroflexed view in the rectum did reveal internal hemorrhoids they were moderate size but not bleedingThe colon was decompressed the scope was removed and the patient tolerated the procedure well.She remains intubated.Plan1. I spoke with her husband regarding the findings of the colonoscopy2. Patient is going to need abdominal ultrasound that was scheduled for today but not done3. She is growing Gram-negative rods lactose fermenting out of her urine with hematuria a and pyuria she is on ceftriaxone4.  Lactic acid has improved5 hepatitis serology abdominal ultrasound AFP orderedWill continue to closely patient's husband is looking forward to speaking with social service further

## 2019-11-04 NOTE — Other
Post Anesthesia Transfer of Care NotePatient: Melinda B BrengleProcedure(s) Performed: Procedure(s) (LRB):COLONOSCOPY, FLEXIBLE; W/BX, SINGLE/MULTIPLE (N/A)Patient location: ICULast Vitals:BP 128/82 Temp n/a Pulse 90 Resp 14 SpO2 96 Level of consciousness: sedatedTransport Vital Signs:  Stable since the last set of recorded intra-operative vital signsComplications: noneIntra-operative Intake & Output and Antibiotics as per Anesthesia record and discussed with the RN.

## 2019-11-04 NOTE — Progress Notes
[facility]	General Medicine Progress NotePatient Data:  Patient Name: Melinda George Age: 69 y.o. DOB: 11-07-1950	 MRN: [id number]	 Hospital Medicine Progress Melinda George, MDSubjective: Patient is seen and examined at bedside. Patient is not in acute distress.  Had 3 BM last night with blood clots. MEDS:  I have reviewed Medications & Allergy ListCurrent Facility-Administered Medications Medication Dose Route Frequency Provider Last Rate Last Admin ? cefTRIAXone (ROCEPHIN) 1 g in water for injection, sterile 10 mL (100 mg/mL)  1 g IV Push Q24H Melinda Ping, MD   1 g at 11/03/19 1714 ? chlorhexidine gluconate (PERIDEX) 0.12 % solution 15 mL  15 mL Mouth/Throat Q12H Melinda Ping, MD   15 mL at 11/03/19 2040 ? [START ON 11/06/2019] folic acid (FOLVITE) tablet 1 mg  1 mg Oral Daily Main, Marnee Spring, MD     ? folic acid syringe 1 mg  1 mg IV Push Daily Main, Marnee Spring, MD   1 mg at 11/03/19 1421 ? ipratropium-albuteroL (DUO-NEB) 0.5 mg-3 mg(2.5 mg base)/3 mL nebulizer solution 3 mL  3 mL Nebulization Q12H Melinda Antwine, MD   3 mL at 11/04/19 0813 ? magnesium citrate solution 296 mL  296 mL Oral Once Melinda Bob, MD     ? ondansetron (PF) (ZOFRAN) injection 4 mg  4 mg IV Push Once Melinda Stairs, MD     ? pantoprazole (PROTONIX) 40 mg in sodium chloride 0.9% PF 10 mL (4 mg/mL)  40 mg IV Push Q12H Melinda Ping, MD   40 mg at 11/03/19 2040 ? sodium chloride 0.9 % flush 3 mL  3 mL IV Push Q8H Main, Marnee Spring, MD   3 mL at 11/04/19 0724 ? thiamine (VITAMIN B1) injection 100 mg  100 mg IV Push Daily Main, Marnee Spring, MD   100 mg at 11/03/19 0431 ? [START ON 11/06/2019] thiamine (VITAMIN B1) tablet 100 mg  100 mg Oral Daily Main, Marnee Spring, MD     ? vecuronium (NORCURON) 10 mg injection          .Past Medical History: Diagnosis Date ? Alcohol abuse, in remission 12/15/2011 ? Alcohol dependence (HC Code)  ? Body mass index 39.0-39.9, adult 06/02/2013 ? Depressive disorder 12/21/2009 ? Essential hypertension 12/21/2009  CONT CURRENT MEDS, CR AUG 2014 0.9 ? Essential tremor 08/22/2010 ? Gastroesophageal reflux disease 12/21/2009 ? Generalized anxiety disorder 12/21/2009 ? Hyperlipidemia 12/21/2009 ? Obesity 05/28/2013 ? Rosacea 12/21/2009 ? Sprain of ankle 04/30/2010 Physical Exam:Temp:  [98.3 ?F (36.8 ?C)-99.6 ?F (37.6 ?C)] 99.1 ?F (37.3 ?C)Pulse:  [78-115] 81Resp:  [2-25] 16BP: (97-176)/(41-98) 130/65SpO2:  [94 %-100 %] 99 %Intake/Output Summary (Last 24 hours) at 11/04/2019 0819Last data filed at 11/04/2019 0600Gross per 24 hour Intake 2652.95 ml Output 1115 ml Net 1537.95 ml Constitutional: intubated and sedated.Appears well-developed and well-nourished. HENT: Head: Normocephalic and atraumatic. Eyes: Conjunctivae and EOM are normal. Neck: No JVDCardiovascular: Normal rate and regular rhythm. Pulmonary/Chest: Effort normal and breath sounds normal. Abdominal: Soft.  increased bowel sounds. No tenderness. No rigidity.  No rebound tenderness.  mild to moderate distensionMusculoskeletal:  Exhibits no deformity. Neurological:  limited as patient is intubated and sedatedSkin: Skin is warm and dry. Psychiatric:  limited as patient is intubated and sedatedData:Recent Labs Lab 03/17/210406 03/17/210851 03/18/210555 03/18/210606 NA 138  --  142  --  K 4.8  --  4.0  --  CL 103  --  109*  --  CO2 22  --  24  --  BUN 19*  --  23*  --  CREATININE 1.18*  --  1.34*  --  GLU 121*  --  138*  --  CALCIUM 8.8  --  7.9*  --  MG  --  1.7 1.7  --  PHOS  --  3.8  --  3.3  Recent Labs Lab 03/17/210406 03/18/210555 ALBUMIN 3.0* 2.6* PROT 6.5 5.5* BILITOT 0.4 0.5 ALKPHOS 96 68 ALT 75 45 AST 82* 34 Recent Labs Lab 03/17/210851 03/17/211612 03/18/210554 WBC 5.81 5.15 6.38 HGB 10.9* 9.8* 8.6* HCT 33.7* 29.0* 26.6* PLT 184 159 160 Recent Labs Lab 03/17/210406 INR 1.0 Recent Labs Lab 03/17/211201 LABBLOO No Growth to Date - No Growth to Date No results for input(s): LABURIN in the last 168 hours.No results found for this or any previous visit (from the past 1008 hour(s)).I have reviewed patients Radiology images/reports.  Assessment/Plan:GI bleed. Hematemesis Required intubation to protect airwayEGD showed non bleeding peptic ulcerGetting colonoscopy after bowel prepOff octroetide as patient has no bleeding varicesAnemia 2/2 abovetransfusing 1 U today as her baseline around 13.protonix iv bidLactic acidosisresolvedAlcohol disorderMonitor for withrawal. As patient drinks 1.5 L of white wine dailyThiamine and folic acidModerate/severe tremor2/2 essential tremorSees neurology in outpatientMild AKICr trending upWill get blood today and will keep gentle hydration afterTrend BMP tomorrowMicroscopic hematuriaWill need follow up ua in outpatient. DVT prophylaxis: SCDCare/plan discussed with patient and bedside nurse, intensivist and GI.I have confirmed that the patient's advanced care plan is present or surrogate decision maker is listed in the patient's medical record.I have utilized all available immediate resources to obtain, update, or review the patient's current medications.Full Code/ACLSLength of visit 45 minutes, with >50% spent counseling and coordinating care.Discharge Plan:  >48h.Signed:Shevaun Lovan Burgess George, MD3/18/202111:25

## 2019-11-04 NOTE — Anesthesia Post-Procedure Evaluation
Anesthesia Post-op NotePatient: Melinda B BrengleProcedure(s):  Procedure(s) (LRB):COLONOSCOPY, FLEXIBLE; W/BX, SINGLE/MULTIPLE (N/A) Patient location: ICULast Vitals:  I have noted the vital signs as listed in the nursing notes.Mental status recovered: patient participates in evaluation: No, sedated, intubatedVital signs reviewed: YesRespiratory function stable:YesAirway is patent: YesCardiovascular function and hydration status stable: YesPain control satisfactory: YesNausea and vomiting control satisfactory:Yes

## 2019-11-04 NOTE — Plan of Care
Plan of Care Overview/ Patient Status Patient remains intubated and on ordered ventilator settings.  Will continue to monitor.

## 2019-11-04 NOTE — Consults
[facility]	 Pulmonary Critical Care ICU progress notePatient's name:Melinda George; 69 y.o.[id number]; 1952-12-06ER date: 3/17/2021Admit date: 11/03/2019 Hospital Day: 1Today's date: 11/04/2019 Presentation History: Interim events:3/18/2021Underwent a GED which showed nonbleeding shallow grade ulcers.  Small esophageal versus 0 to +1 with no stigmata of recent bleeding colonoscopy  Today3/17/2021HPI: Melinda George is a 69 y.o. female with history of on history of alcohol dependence currently in a long-term program involving his breathalyzer, therapist and psychiatrist.  She will cover approximately 1:00 a.m. on the morning of admission and was nauseous and retching and had bright red blood per rectum.  She has been drinking up to 1.5 liters wine per day.  Her alcohol level was 0.07 on arrival.  Hemoglobin was stable at 12 and then drop to 10Transferred to ICU/CCU on 03/17/2020Review of Allergies/Medical History/Medications: PMH PSH Past Medical History: Diagnosis Date ? Alcohol abuse, in remission 12/15/2011 ? Alcohol dependence (HC Code)  ? Body mass index 39.0-39.9, adult 06/02/2013 ? Depressive disorder 12/21/2009 ? Essential hypertension 12/21/2009  CONT CURRENT MEDS, CR AUG 2014 0.9 ? Essential tremor 08/22/2010 ? Gastroesophageal reflux disease 12/21/2009 ? Generalized anxiety disorder 12/21/2009 ? Hyperlipidemia 12/21/2009 ? Obesity 05/28/2013 ? Rosacea 12/21/2009 ? Sprain of ankle 04/30/2010  Past Surgical History: Procedure Laterality Date ? COLONOSCOPY  12/12/2010  DONE AT [facility] RECALL IN 5 YEARS ? HYSTERECTOMY   ? LUMBAR DISC SURGERY  08/19/1992  LUMBAR ? TOTAL ABDOMINAL HYSTERECTOMY W/ BILATERAL SALPINGOOPHORECTOMY  08/20/1995 ? TOTAL ELBOW REPLACEMENT  05/2014  [email or web address] Family History   Social History   family history includes ADHD in her brother and another family member; Alcohol abuse in an other family member; Breast cancer in her maternal aunt and paternal aunt; Depression in an other family member; Endometriosis in an other family member; Heart disease in her father and another family member; Hypertension in an other family member; Osteoporosis in her maternal grandmother and another family member; Preeclampsia in her sister and another family member; Stroke in her maternal grandmother and another family member; Thyroid disease in her sister and another family member.  Heme history reviewed at bedside.  No new pertinent to add  reports that she has quit smoking. Her smoking use included cigarettes. She has a 10.00 pack-year smoking history. She has never used smokeless tobacco. She reports current alcohol use of about 84.0 standard drinks of alcohol per week. She reports that she does not use drugs. @HMED @I  have utilized all available immediate resources to obtain, update, or review the patient's current medications. Current Medications:AllergiesAllergies Allergen Reactions ? Lactose (Bulk) Rash ? Lanolin Rash ? Nickel Rash Scheduled:Current Facility-Administered Medications Medication Dose Route Frequency Provider Last Rate Last Admin ? cefTRIAXone (ROCEPHIN) 1 g in water for injection, sterile 10 mL (100 mg/mL)  1 g IV Push Q24H Ferdie Ping, MD   1 g at 11/03/19 1714 ? chlorhexidine gluconate (PERIDEX) 0.12 % solution 15 mL  15 mL Mouth/Throat Q12H Ferdie Ping, MD   15 mL at 11/04/19 0959 ? [START ON 11/06/2019] folic acid (FOLVITE) tablet 1 mg  1 mg Oral Daily Main, Marnee Spring, MD     ? folic acid syringe 1 mg  1 mg IV Push Daily Main, Marnee Spring, MD   1 mg at 11/04/19 1478 ? ipratropium-albuteroL (DUO-NEB) 0.5 mg-3 mg(2.5 mg base)/3 mL nebulizer solution 3 mL  3 mL Nebulization Q12H Ferdie Ping, MD   3 mL at 11/04/19 0813 ? ondansetron (  PF) (ZOFRAN) injection 4 mg  4 mg IV Push Once Nicholes Stairs, MD     ? pantoprazole (PROTONIX) 40 mg in sodium chloride 0.9% PF 10 mL (4 mg/mL)  40 mg IV Push Q12H Ferdie Ping, MD   40 mg at 11/04/19 0959 ? sodium chloride 0.9 % flush 3 mL  3 mL IV Push Q8H Main, Marnee Spring, MD   3 mL at 11/04/19 0724 ? thiamine (VITAMIN B1) injection 100 mg  100 mg IV Push Daily Main, Marnee Spring, MD   100 mg at 11/04/19 1610 ? [START ON 11/06/2019] thiamine (VITAMIN B1) tablet 100 mg  100 mg Oral Daily Main, Marnee Spring, MD     ? vecuronium (NORCURON) 10 mg injection          Infusions:? midazolam (VERSED) 100 mg in 100 mL (1 mg/mL) infusion 18 mg/hr (11/04/19 1100) ? sodium chloride 100 mL/hr (11/04/19 0723) PRN Meds:albuterol, ipratropium-albuteroL, midazolam (PF), midazolam, morphine (ADULT), ondansetron, simethicone, sodium chlorideReview of Systems: Pertinent positives discussed in HPI.  Otherwise a 14 point review of system was negative full detail.Physical Exam: Vital Signs:Most recent: Patient Vitals for the past 24 hrs: BP Temp Temp src Pulse Resp SpO2 Height Weight 11/04/19 1159 ? ? ? 79 16 100 % ? ? 11/04/19 1136 116/60 97.6 ?F (36.4 ?C) Temporal 81 18 99 % ? ? 11/04/19 1116 124/70 97.7 ?F (36.5 ?C) Temporal 83 17 99 % ? ? 11/04/19 1100 137/72 ? ? 82 17 99 % ? ? 11/04/19 1000 130/74 ? ? 86 18 99 % ? ? 11/04/19 0944 ? ? ? 87 16 97 % ? ? 11/04/19 0836 ? ? ? (!) 91 16 99 % ? ? 11/04/19 0828 ? ? ? ? ? 100 % ? ? 11/04/19 0800 ? 98 ?F (36.7 ?C) Temporal ? ? ? ? ? 11/04/19 0700 130/65 ? ? 81 16 99 % ? ? 11/04/19 0600 134/81 ? ? 89 (!) 25 95 % ? ? 11/04/19 0500 (!) 115/50 ? ? 90 17 96 % ? ? 11/04/19 0459 ? ? ? 85 17 95 % ? ? 11/04/19 0400 (!) 121/45 99.1 ?F (37.3 ?C) Temporal 90 16 96 % ? ? 11/04/19 0300 (!) 122/43 ? ? 87 16 95 % ? ? 11/04/19 0200 (!) 121/47 ? ? 88 16 96 % ? ? 11/04/19 0100 (!) 115/53 ? ? (!) 91 16 97 % ? ? 11/04/19 0011 ? ? ? (!) 91 17 97 % ? ? 11/04/19 0000 (!) 119/41 99.4 ?F (37.4 ?C) Temporal (!) 95 17 97 % ? ? 11/03/19 2200 (!) 122/54 ? ? (!) 101 17 98 % ? ? 11/03/19 2100 135/65 ? ? (!) 98 16 97 % ? ? 11/03/19 2000 133/69 99.6 ?F (37.6 ?C) Temporal 89 16 97 % ? ? 11/03/19 1900 (!) 142/83 ? ? (!) 94 20 99 % ? ? 11/03/19 1800 (!) 123/52 ? ? (!) 100 17 ? ? ? 11/03/19 1700 124/83 ? ? (!) 105 18 100 % ? ? 11/03/19 1631 ? ? ? ? 17 ? ? ? 11/03/19 1600 (!) 148/87 ? ? (!) 104 (!) 21 100 % ? ? 11/03/19 1545 133/75 ? Temporal (!) 103 16 99 % ? ? 11/03/19 1529 (!) 176/76 ? ? (!) 107 16 100 % ? ? 11/03/19 1510 (!) 175/90 ? ? (!) 115 18 100 % ? ? 11/03/19 1500 (!) 175/98 ? ? (!) 114 16 100 % ? ? 11/03/19 1455 ? ? ? ? ?  100 % ? ? 11/03/19 1451 (!) 150/56 ? ? (!) 106 20 100 % ? ? 11/03/19 1440 (!) 167/55 ? ? (!) 101 17 99 % ? ? 11/03/19 1432 116/60 ? ? (!) 94 15 100 % 5' 6 (1.676 m) 104.4 kg 11/03/19 1400 116/60 ? ? 78 16 98 % ? ? 11/03/19 1300 ? ? ? 77 ? ? ? ? O2 Sat and Device used: SpO2: 100 %Device (Oxygen Therapy): mechanical ventilatorVitals Min & Max: BP  Min: 115/50  Max: 176/76Temp  Avg: 98.6 ?F (37 ?C)  Min: 97.6 ?F (36.4 ?C)  Max: 99.6 ?F (37.6 ?C)Pulse  Avg: 93.1  Min: 78  Max: 115Resp  Avg: 17.2  Min: 15  Max: 25SpO2  Avg: 98.2 %  Min: 95 %  Max: 100 %Height  Avg: 5' 6 (167.6 cm)  Min: 5' 6 (167.6 cm)  Max: 5' 6 (167.6 cm)Weight  Avg: 104.4 kg  Min: 104.4 kg  Max: 104.4 kgIntake/Output:Gross Totals (Last 24 hours) at 11/04/2019 1251Last data filed at 11/04/2019 1101Intake 4540.92 ml Output 1115 ml Net 3425.92 ml Weights:Wt: 11/03/19 104.4 kg 09/15/18 104.8 kg 04/14/18 101.6 kg Vent Settings/oxygen settings:Vent mode: PRVC A/C (11/04/19 1159)Rate (bpm): 16 bpm (11/04/19 1159) Vt (mL): 450 mL (11/04/19 1159)PEEP/CPAP (cmH2O): 5 cmH20 (11/04/19 1159)Oxygen Concentration (%): 40 (11/04/19 1159)Peak Inspiratory Pressure (cm H2O) : 26 (11/04/19 1159)Plateau Pressure (cm H2O): 17 (11/04/19 1159)Static Compliance (mL/cm H2O): 42 (11/04/19 1159)  Physical Examination: General Intubated sedated   HEENT Sclera anciteric, Moist membranes.Neck:  Trachea midline.ETT: Yes []  No []  Pupil size  3 mm Respiratory  Coarse breath sounds with scattered rhonchi Extremity  Trace edema. Pulses 2+ bilaterally. Cardiovascular Regular without murmurs, gallops, or heave Neuro No focal deficitsFollows commands? Yes []  No []  GI/GU Soft, non-tender, normoactive bowel soundsG tube: Yes []  No [] Foley: Yes []  No []  Skin No rashes     Lines/Tubes: Patient Lines/Drains/Airways Status  Active PICC Line / CVC Line / PIV Line / Drain / ART Line / NG/OG Tube / Catheter   Name:   Placement date:   Placement time:   Site:   Days:  Periph IV-single(adult) 11/03/19 0401 median cubital(antecubital fossa), right 20 gauge Nurse   11/03/19    0401    median cubital(antecubital fossa), right   less than 1    Review of Labs/Diagnostics: Lab Review:I personally have reviewed hold all the patient's morning labsLaboratory Data: Labs Heme       - CBCRecent Labs Lab 03/17/210851 03/17/211612 03/18/210554 WBC 5.81 5.15 6.38 HGB 10.9* 9.8* 8.6* HCT 33.7* 29.0* 26.6* PLT 184 159 160  Heme         - CoagsRecent Labs Lab 03/17/210406 INR 1.0  Renal Recent Labs Lab 03/17/210406 03/17/210851 03/18/210555 03/18/210606 NA 138  --  142  --  K 4.8  --  4.0  --  CL 103  --  109*  --  CO2 22  --  24  --  BUN 19*  --  23*  --  CREATININE 1.18*  --  1.34*  --  CALCIUM 8.8  --  7.9*  --  MG  --  1.7 1.7  --  PHOS  --  3.8  --  3.3  GI/GU       - LFTsRecent Labs Lab 03/17/210406 03/18/210555 ALT 75 45 AST 82* 34 ALKPHOS 96 68 BILITOT 0.4 0.5 BILIDIR <0.10  --      - LIPASENo results for input(s): LIPASE in the last 168 hours.      -  Recent Labs Lab 03/17/210851 AMMONIA 31  CV    - Cardiac EnzymesNo results for input(s): TROPONINI, CKTOTAL, CKMB in the last 168 hours.Invalid input(s): TROPONINPOC     pBNPNo components found for: PROBNP Endocrine   - No results for input(s): TSH in the last 168 hours.     - No results found for: FREET4  Endocrine    - Recent Labs Lab 03/17/210406 03/18/210555 GLU 121* 138*  Endocrine -  No results found for: HGBA1C   ID  Recent Labs Lab 03/17/210848 FERRITIN 271  Pending Labs: Pending Lab Results   Order Current Status  Pathology [facility]) Collected (11/03/19 1530)  Blood culture Preliminary result  Blood culture Preliminary result  Urine culture Preliminary result     ABG Data:Recent Labs Lab 03/17/211539 03/17/211845 03/18/210537 PHART 7.41 7.41 7.41 PCO2ART 41 41 38 PO2ART 36* 189* 81 HCO3ART 25.6 25.5 24.0 Respiratory Culture:Lab Results Component Value Date  SARSCOV2 Negative 11/03/2019 Microbiology:No results found for this or any previous visit.Urine cultures:Results for orders placed or performed during the hospital encounter of 11/03/19 Urine culture  Collection Time: 11/03/19  1:48 PM  Specimen: Urine, Clean Catch Result Value  Urine Culture, Routine (A)   >=100,000 CFU/mL Gram Negative Rods, Lactose Fermenting     Susceptibility  Gram Negative Rods, Lactose Fermenting -  (no method available)   *  *  Imaging Review:Serial CXR reviewed while in ICU/CCUI personally reviewed the patient's recent imaging to include Most up-to-date imaging as of 11/04/19 :2/282/019 negativeCT scans:noneEKGs:No results found for this or any previous visit. CLINICAL IMPRESSION Melinda George is a 69 y.o.female with probable upper GI bleed   ASSESSMENT AND PLAN 1.	Cardiovascular: ?	Hemodynamically stable not on pressors2. Pulmonary:?	Will keep her intubated today again colonoscopy?	May be able to extubate tomorrow but if still going through alcohol withdrawal just leave intubated?	I personally reviewed the patient's recent imaging to include  [id number] which shows ET tube in correct location3. Neurological:        ?	High risk for alcohol withdrawal?	Thiamine/folate supplementation?	Minimize benzos, frequent orientation, minimize lines and tubes, optimize sleep-wake cycles. ?	Minds protocol while extubated but Versed drip while intubated.  Can use phenobarb if needed4. Gastrointestinal:?	Probable upper GI bleed?	Question variceal bleed?	 octreotide drip started initially but stopped after upper EGD5. Renal: ?	Ceftriaxone for GI prophylaxis7. FEN: ?	K: 4.0, goal > 4?	Mg: 1.7  goal > 2?	Phosphorous: 3.3?	Diet:  NPO?	I personally have reviewed hold all the patient's morning labs8. Heme/Onc: ?	Hemoglobin decreased from 9.8-8.69. Endo: ?	Blood sugars normal at 118 to 121?	Procedural recommendations at this time (if any):  EGDCRITICAL HOME MEDICATIONS ON HOLD NaltrexoneNebivololI have reviewed the patient's problem list and updated it as needed.Prophylaxis: DVT Prophylaxis: SCDsPUD Prophylaxis: protonixICU ppx: Seizure precautionsDispo/Social: Patient to remain in the MICU.Family and patient updated patient updated at bedsideSurrogate decision maker: Primary Emergency Contact: Seckman,George, Home Phone: [phone] confirmed that the patient's advanced care plan is present, code status is documented, or surrogate decisionmaker is listed in the patient's medical record. CODE status: Full Code/ACLS Primary Emergency Contact: Cowper,George, Home Phone: [phone] care statement: The patient has required 60 minutes of my undivided attention during the past 24 hours for one of the previously noted life threatening diagnoses. Leone Payor, MDElectronically Signed by Myrene Buddy Ijanae Macapagal, MD3/18/202110:41 AM

## 2019-11-04 NOTE — Progress Notes
Patient seen examined prior to colonoscopy.  Patient signed her consent Yesterday prior to intubation.Hemoglobin today 8.6 hematocrit 26.3 creatinine 1.34 BUN 23 lactic acid 1.1.  She has prepare for colonoscopy via NG tube.  She has had large amount of dark red material rectally and she is willing to undergo colonoscopy.

## 2019-11-04 NOTE — Plan of Care
Plan of Care Overview/ Patient Status    Bedside colonoscopy done, see procedure note. 1 uprbc given. Sedated for rass goal. See flow sheets. Pt to be handed off.Problem: Adult Inpatient Plan of CareGoal: Plan of Care ReviewOutcome: Interventions implemented as appropriateGoal: Patient-Specific Goal (Individualized)Outcome: Interventions implemented as appropriateGoal: Absence of Hospital-Acquired Illness or InjuryOutcome: Interventions implemented as appropriateGoal: Optimal Comfort and WellbeingOutcome: Interventions implemented as appropriateGoal: Readiness for Transition of CareOutcome: Interventions implemented as appropriate Problem: InfectionGoal: Infection Symptom ResolutionOutcome: Interventions implemented as appropriate Problem: Alcohol WithdrawalGoal: Alcohol Withdrawal Symptom ControlOutcome: Interventions implemented as appropriate Problem: Skin Injury Risk IncreasedGoal: Skin Health and IntegrityOutcome: Interventions implemented as appropriate Problem: Fall Injury RiskGoal: Absence of Fall and Fall-Related InjuryOutcome: Interventions implemented as appropriate Problem: Restraint, Nonbehavioral (Nonviolent)Goal: Discontinuation Criteria AchievedOutcome: Interventions implemented as appropriateGoal: Personal Dignity and Safety MaintainedOutcome: Interventions implemented as appropriate

## 2019-11-04 NOTE — Progress Notes
[facility] [employer] [facility]	 Pulmonary Critical Care ICU Progress NotePatient's name:Melinda George; 69 y.o.[id number]; July 02, 1952ER date: 3/17/2021Admit date: 11/03/2019 Hospital Day: 1Today's date: 11/04/2019 Interim Events: 11/04/2019 Remains intubated, sedated on Versed gtt.  No acute events overnight.  EGD yesterday showing nonbleeding gastric ulcers, diffuse gastritis and esophageal varices without bleed.  No evidence of Mallory-Weiss tear.  Off octreotide.  Colonoscopy planned today.HPI:69 year old female with history morbid obesity, alcohol dependence, HTN, HLD, GERD and depressive disorder.  Apparently consumed 1.5 liters of wine daily with an additional 750 mL or 3 bottles of wine daily.  She has been actively drinking for years, but did have a period of sobriety from 2007 to 2012 after a program at [place] [facility] facility.  She is currently involved in the RIA program with a therapist, psychiatrist and breathalyzer evaluations through the computer which apparently allows her to slowly decrease alcohol intake.  She has, however been on able to wean off alcohol.  She presented to the emergency room 11/03/2019 with complaints of nausea with retching as well as BRBPR.  Intubated in the emergency room for emergent EGD and airway protection.  Endoscopy results as documented above.  Planning further evaluation with colonoscopy today.Current Medications:I have utilized all available immediate resources to obtain, update, or review the patient's current medications. AllergiesAllergies Allergen Reactions ? Lactose (Bulk) Rash ? Lanolin Rash ? Nickel Rash Scheduled:Current Facility-Administered Medications Medication Dose Route Frequency Provider Last Rate Last Admin ? cefTRIAXone (ROCEPHIN) 1 g in water for injection, sterile 10 mL (100 mg/mL)  1 g IV Push Q24H Ferdie Ping, MD   1 g at 11/03/19 1714 ? chlorhexidine gluconate (PERIDEX) 0.12 % solution 15 mL  15 mL Mouth/Throat Q12H Ferdie Ping, MD   15 mL at 11/04/19 0959 ? fentaNYL PF (SUBLIMAZE) 50 mcg/mL injection          ? [START ON 11/06/2019] folic acid (FOLVITE) tablet 1 mg  1 mg Oral Daily Main, Marnee Spring, MD     ? folic acid syringe 1 mg  1 mg IV Push Daily Main, Marnee Spring, MD   1 mg at 11/04/19 4098 ? ipratropium-albuteroL (DUO-NEB) 0.5 mg-3 mg(2.5 mg base)/3 mL nebulizer solution 3 mL  3 mL Nebulization Q12H Ferdie Ping, MD   3 mL at 11/04/19 0813 ? midazolam (PF) (VERSED) 1 mg/mL injection          ? ondansetron (PF) (ZOFRAN) injection 4 mg  4 mg IV Push Once Nicholes Stairs, MD     ? pantoprazole (PROTONIX) 40 mg in sodium chloride 0.9% PF 10 mL (4 mg/mL)  40 mg IV Push Q12H Ferdie Ping, MD   40 mg at 11/04/19 0959 ? sodium chloride 0.9 % flush 3 mL  3 mL IV Push Q8H Main, Marnee Spring, MD   3 mL at 11/04/19 1416 ? thiamine (VITAMIN B1) injection 100 mg  100 mg IV Push Daily Main, Marnee Spring, MD   100 mg at 11/04/19 1191 ? [START ON 11/06/2019] thiamine (VITAMIN B1) tablet 100 mg  100 mg Oral Daily Main, Marnee Spring, MD     ? vecuronium (NORCURON) 10 mg injection          Infusions:? midazolam (VERSED) 100 mg in 100 mL (1 mg/mL) infusion 18 mg/hr (11/04/19 1100) ? sodium chloride 100 mL/hr (11/04/19 0723) PRN Meds:albuterol, ipratropium-albuteroL, midazolam (PF), midazolam, morphine (ADULT), ondansetron, simethicone, sodium chlorideVital Signs:Temp:  [97.6 ?F (36.4 ?C)-99.6 ?F (37.6 ?C)] 97.7 ?F (36.5 ?C)Pulse:  [79-115]  103Resp:  [16-25] 19BP: (115-176)/(41-98) 123/60SpO2:  [95 %-100 %] 99 %Device (Oxygen Therapy): mechanical ventilator I/O last 3 completed shifts:In: 2653 [I.V.:2413; NG/GT:240]Out: 1115 [Urine:1115] Gross Totals (Last 24 hours) at 11/04/2019 1456Last data filed at 11/04/2019 1400Intake 4821.92 ml Output 915 ml Net 3906.92 ml Last 6 Weights  11/03/19 0335 11/03/19 1000 11/03/19 1432 Weight (lbs): 231.26 Lbs. 230.16 Lbs. 230.16 Lbs. Vent Settings/oxygen settings:Vent mode: PRVC A/C (11/04/19 1349)Rate (bpm): 16 bpm (11/04/19 1349)Vt (mL): 450 mL (11/04/19 1349)PEEP/CPAP (cmH2O): 5 cmH20 (11/04/19 1349)Oxygen Concentration (%): 40 (11/04/19 1349)Peak Inspiratory Pressure (cm H2O) : 19 (11/04/19 1349)Plateau Pressure (cm H2O): 17 (11/04/19 1349)Static Compliance (mL/cm H2O): 42 (11/04/19 1349)  Physical Examination: General Intubated.  Currently sedate.  No opening of eyes during exam.   HEENT Sclera anciteric, Moist membranes.Neck:  Full neck.  Trachea midline.ETT: Yes [x]  No []  Pupil size  2 mm Respiratory  Coarse anteriorly.  Clear bilateral bases posteriorly.  No crackles or wheeze. Extremity  Trace edema. Pulses 2+ bilaterally. CV Regular without murmurs, gallops, or heave Neuro sedated, intubatedFollows commands? Yes []  No []  GI/GU Soft, non-tender, normoactive bowel soundsG tube: Yes [x]  No [] Foley: Yes [x]  No []  Skin No rashes     Lines/Tubes: Patient Lines/Drains/Airways Status  Active PICC Line / CVC Line / PIV Line / Drain / ART Line / NG/OG Tube / Catheter   Name:   Placement date:   Placement time:   Site:   Days:  Periph IV-single(adult) 11/03/19 0401 median cubital(antecubital fossa), right 20 gauge Nurse   11/03/19    0401    median cubital(antecubital fossa), right   1  Periph IV-single(adult) 11/03/19 1030 dorsum right arm over-the-needle catheter system 20 gauge Nurse   11/03/19    1030    dorsum right arm   1  Urethral Catheter 11/03/19 1455 16 10   11/03/19    1455     1  Naso/Oral Tube 11/03/19 1530 [place] sump 16 left nostril   11/03/19    1530     1    Laboratory Data: Labs Heme       - CBCRecent Labs Lab 03/17/210851 03/17/211612 03/18/210554 WBC 5.81 5.15 6.38 HGB 10.9* 9.8* 8.6* HCT 33.7* 29.0* 26.6* PLT 184 159 160  Heme         - CoagsRecent Labs Lab 03/17/210406 INR 1.0  Renal Recent Labs Lab 03/17/210406 03/17/210851 03/18/210555 03/18/210606 NA 138  --  142  --  K 4.8  --  4.0  --  CL 103  --  109*  --  CO2 22  --  24  --  BUN 19*  --  23*  --  CREATININE 1.18*  --  1.34*  --  CALCIUM 8.8  --  7.9*  --  MG  --  1.7 1.7  --  PHOS  --  3.8  --  3.3  GI/GU       - LFTsRecent Labs Lab 03/17/210406 03/18/210555 ALT 75 45 AST 82* 34 ALKPHOS 96 68 BILITOT 0.4 0.5 BILIDIR <0.10  --      - LIPASENo results for input(s): LIPASE in the last 168 hours.      - Recent Labs Lab 03/17/210851 AMMONIA 31  CV    - Cardiac EnzymesNo results for input(s): TROPONINI, CKTOTAL, CKMB in the last 168 hours.Invalid input(s): TROPONINPOC     pBNPNo components found for: PROBNP Endocrine   - No results for input(s): TSH in the last 168 hours.     - No  results found for: FREET4  Endocrine    - Recent Labs Lab 03/17/210406 03/18/210555 GLU 121* 138*  Endocrine -  No results found for: HGBA1C   ID  Recent Labs Lab 03/17/210848 FERRITIN 271  Pending Labs: Pending Lab Results   Order Current Status  Pathology [facility]) Collected (11/03/19 1530)  MRSA surveillance by PCR     (LMW) In process  Blood culture Preliminary result  Blood culture Preliminary result  Urine culture Preliminary result     ABG Data:Recent Labs Lab 03/17/211539 03/17/211845 03/18/210537 PHART 7.41 7.41 7.41 PCO2ART 41 41 38 PO2ART 36* 189* 81 HCO3ART 25.6 25.5 24.0 Respiratory Culture:Lab Results Component Value Date  SARSCOV2 Negative 11/03/2019 Microbiology:Blood cultures 3/17: NGTDUrine cultures: Results for orders placed or performed during the hospital encounter of 11/03/19 Urine culture  Collection Time: 11/03/19  1:48 PM  Specimen: Urine, Clean Catch Result Value  Urine Culture, Routine (A)   >=100,000 CFU/mL Gram Negative Rods, Lactose Fermenting     Susceptibility  Gram Negative Rods, Lactose Fermenting -  (no method available)   *  *  Imaging Review:Chest x-ray 3/17:Endotracheal tube in satisfactory position. Nasogastric tube in the stomach. Cardiomegaly without congestive heart failure discrete confluent parenchymal consolidation.Parkman scans:N/AOther Imaging:N/AEKGs:No results found for this or any previous visit. Serial CXR reviewed while in ICU/CCU CLINICAL IMPRESSION  Melinda George is a 69 y.o.female with PMH alcohol dependence, GERD, HTN, HLD, depressive disorder and morbid obesity.  Admitted with lactic acidosis, UTI, GIB.   ASSESSMENT AND PLAN 1.	Cardiovascular: ?	BP stable, but having some mild tachycardia?	On beta-blocker outpatient?	May be reflex tachycardia?	Add Lopressor 50 milligram b.i.d.  (substituted for outpatient Bystolic dose)2. Pulmonary:?	Intubated 3/17?	ABG this morning, 7.41/38/81/24.0?	Continue bronchodilators?	Keep intubated for planned colonoscopy today?	Monitor for withdrawal symptoms prior to consideration for extubation3. Neurological:        ?	Wernicke's prophylaxis?	Versed drip while intubated?	Phenobarb if needed4. Gastrointestinal:  GI bleed?	EGD 3/17 showing gastric ulcers, diffuse gastritis and varices without bleed.  No evidence  Mallory-Weiss tear?	Off octreotide?	Colonoscopy planned today?	1 unit packed cells today per hospitalist service?	Follow H&H?	IV Protonix b.i.d.5. Renal:  UTICr Cl in last 30 days: Serum creatinine: 1.34 mg/dL (H) 16/10/96 [id number] creatinine clearance: 37 mL/min (A) ?	Ceftriaxone, day 2 ?	Creatinine up with unknown baseline, follow?	Net IO Since Admission: 3,706.92 mL [11/04/19 1456] 6. Infectious Disease:              Blood cultures 3/17, NGTD             Urine culture with growth of GNRs             No leukocytosis              Afebrile             Short course ceftriaxone         7. FEN: ?	K:  4.0, goal > 4?	Mg:  1.7, goal > 2?	Phosphorous:  3.3?	Diet:  Currently NPO for colonoscopy?	I personally have reviewed hold all the patient's morning labs8. Heme/Onc: ?	1 unit packed cells9. Endo: ?	Stable fasting glucoseProcedural recommendations at this time (if any):  NoneCRITICAL HOME MEDICATIONS ON HOLD Lexapro (resumed)Bystolic (Lopressor substituted)NaltrexoneI have reviewed the patient's problem list and updated it as needed.Prophylaxis: DVT Prophylaxis: SCDsPUD Prophylaxis: protonixICU ppx: Aspiration precautions, HOB > 30 degreesEarly mobilization, out of bed if ableDispo/Social: Patient to remain in the MICU.Surrogate decision maker: Primary Emergency Contact: Lunney,George, Home Phone: [phone] confirmed that the patient's advanced care plan is present, code status is documented, or surrogate decisionmaker is listed in the patient's medical record.  CODE  status: Full Code/ACLS Primary Emergency Contact: Vokes,George, Home Phone: [phone] care statement: The patient has required 45  minutes of my undivided attention during the past 24 hours for one of the previously noted life threatening diagnoses. Alyse Low, APRNElectronically Signed by Alyse Low, APRN3/18/20212:56 PM

## 2019-11-05 ENCOUNTER — Encounter: Admit: 2019-11-05 | Payer: PRIVATE HEALTH INSURANCE

## 2019-11-05 ENCOUNTER — Inpatient Hospital Stay: Admit: 2019-11-05 | Payer: PRIVATE HEALTH INSURANCE

## 2019-11-05 DIAGNOSIS — F411 Generalized anxiety disorder: Secondary | ICD-10-CM

## 2019-11-05 DIAGNOSIS — Z6839 Body mass index (BMI) 39.0-39.9, adult: Secondary | ICD-10-CM

## 2019-11-05 DIAGNOSIS — F32A Depressive disorder: Secondary | ICD-10-CM

## 2019-11-05 DIAGNOSIS — E669 Obesity, unspecified: Secondary | ICD-10-CM

## 2019-11-05 DIAGNOSIS — F1020 Alcohol dependence, uncomplicated: Secondary | ICD-10-CM

## 2019-11-05 DIAGNOSIS — K922 Gastrointestinal hemorrhage, unspecified: Secondary | ICD-10-CM

## 2019-11-05 DIAGNOSIS — E785 Hyperlipidemia, unspecified: Secondary | ICD-10-CM

## 2019-11-05 DIAGNOSIS — F1011 Alcohol abuse, in remission: Secondary | ICD-10-CM

## 2019-11-05 DIAGNOSIS — G250 Essential tremor: Secondary | ICD-10-CM

## 2019-11-05 DIAGNOSIS — K219 Gastro-esophageal reflux disease without esophagitis: Secondary | ICD-10-CM

## 2019-11-05 DIAGNOSIS — S93409A Sprain of unspecified ligament of unspecified ankle, initial encounter: Secondary | ICD-10-CM

## 2019-11-05 DIAGNOSIS — I10 Essential (primary) hypertension: Secondary | ICD-10-CM

## 2019-11-05 DIAGNOSIS — L719 Rosacea, unspecified: Secondary | ICD-10-CM

## 2019-11-05 LAB — CBC WITH AUTO DIFFERENTIAL
BKR WAM ABSOLUTE IMMATURE GRANULOCYTES.: 0.06 x 1000/ÂµL (ref 0.00–0.10)
BKR WAM ABSOLUTE LYMPHOCYTE COUNT.: 0.77 x 1000/ÂµL — ABNORMAL LOW (ref 1.00–4.50)
BKR WAM ABSOLUTE NEUTROPHIL COUNT.: 4 x 1000/ÂµL (ref 1.40–6.60)
BKR WAM BASOPHIL ABSOLUTE COUNT.: 0.04 x 1000/ÂµL (ref 0.0–0.3)
BKR WAM BASOPHILS: 0.7 % (ref 0.0–3.0)
BKR WAM EOSINOPHIL ABSOLUTE COUNT.: 0.13 x 1000/ÂµL (ref 0.00–0.45)
BKR WAM EOSINOPHILS: 2.3 % (ref 0.0–4.0)
BKR WAM HEMATOCRIT: 28 % — ABNORMAL LOW (ref 34.0–45.0)
BKR WAM HEMOGLOBIN: 9.1 g/dL — ABNORMAL LOW (ref 11.0–15.0)
BKR WAM IMMATURE GRANULOCYTES: 1.1 % — ABNORMAL HIGH (ref 0.0–1.0)
BKR WAM LYMPHOCYTES: 13.6 % — ABNORMAL LOW (ref 25.0–45.0)
BKR WAM MCH PG: 31.5 pg (ref 27–33)
BKR WAM MCHC: 32.5 g/dL (ref 32.0–36.0)
BKR WAM MCV: 96.9 fL (ref 79.0–99.0)
BKR WAM MONOCYTE ABSOLUTE COUNT.: 0.66 x 1000/ÂµL (ref 0.00–1.20)
BKR WAM MONOCYTES: 11.7 % (ref 0.0–12.0)
BKR WAM MPV: 8.9 fL (ref 7.5–11.5)
BKR WAM NEUTROPHILS: 70.6 % — ABNORMAL HIGH (ref 36.0–66.0)
BKR WAM NUCLEATED RED BLOOD CELLS: 0 % (ref 0.0–5.0)
BKR WAM PLATELETS: 155 x1000/ÂµL (ref 140–400)
BKR WAM RDW-CV: 13.8 % (ref 11.5–14.5)
BKR WAM RED BLOOD CELL COUNT.: 2.89 M/ÂµL — ABNORMAL LOW (ref 4.00–5.20)
BKR WAM WHITE BLOOD CELL COUNT.: 5.66 x1000/ÂµL (ref 4.00–10.00)

## 2019-11-05 LAB — BASIC METABOLIC PANEL
BKR ANION GAP (LM): 6 mmol/L (ref 5–15)
BKR BLOOD UREA NITROGEN: 11 mg/dL (ref 7–18)
BKR CALCIUM: 7.7 mg/dL — ABNORMAL LOW (ref 8.5–10.1)
BKR CHLORIDE: 115 mmol/L — ABNORMAL HIGH (ref 98–107)
BKR CO2: 23 mmol/L (ref 21–32)
BKR CREATININE: 1.04 mg/dL — ABNORMAL HIGH (ref 0.55–1.02)
BKR EGFR (AFR AMER) (LMC): >60 mL/min/{1.73_m2} (ref >60)
BKR EGFR (NON AFR AMER) (LMC): 53 mL/min/{1.73_m2} (ref >60)
BKR GLUCOSE: 104 mg/dL (ref 65–110)
BKR POTASSIUM: 3.3 mmol/L — ABNORMAL LOW (ref 3.5–5.1)
BKR SODIUM: 144 mmol/L (ref 136–145)

## 2019-11-05 LAB — VITAMIN D, 25-HYDROXY: BKR VITAMIN D 25-HYDROXY: 16.5 ng/mL — ABNORMAL LOW (ref 30.1–100)

## 2019-11-05 LAB — HEPATITIS C AB WITH REFLEX TO HCV PCR: BKR HEPATITIS C ANTIBODY (LM): NEGATIVE

## 2019-11-05 LAB — HEPATITIS B SURFACE ANTIBODY     (BH GH L LMW YH)
BKR HEP B SURFACE AB INITIAL RESULT (LM): 4.58 m[IU]/mL — ABNORMAL LOW (ref >=10.00)
BKR HEPATITIS B SURFACE AB (LM): NEGATIVE — AB

## 2019-11-05 LAB — URINE CULTURE: BKR URINE CULTURE, ROUTINE: >=100000 — AB

## 2019-11-05 LAB — ALPHAFETOPROTEIN, TUMOR MARKER: BKR AFP-TUMOR MARKER (LMH): 5.4 ng/mL (ref 0.0–8.0)

## 2019-11-05 LAB — ZZZHEMATOCRIT: BKR WAM HEMATOCRIT: 28.2 % — ABNORMAL LOW (ref 34.0–45.0)

## 2019-11-05 LAB — HEMOGLOBIN     (L Q): BKR WAM HEMOGLOBIN: 9 g/dL — ABNORMAL LOW (ref 11.0–15.0)

## 2019-11-05 MED ORDER — THIAMINE HCL (VITAMIN B1) 100 MG TABLET
100 mg | Freq: Every day | ORAL | Status: DC
Start: 2019-11-05 — End: 2019-11-10
  Administered 2019-11-05 – 2019-11-10 (×5): 100 mg via ORAL

## 2019-11-05 MED ORDER — PANTOPRAZOLE IV PUSH 40 MG VIAL & NS (ADULTS)
Freq: Every day | INTRAVENOUS | Status: DC
Start: 2019-11-05 — End: 2019-11-10
  Administered 2019-11-06 – 2019-11-10 (×5): 10.000 mL via INTRAVENOUS

## 2019-11-05 MED ORDER — POTASSIUM BICARBONATE-CITRIC ACID 25 MEQ EFFERVESCENT TABLET
25 mEq | Freq: Once | ORAL | Status: CP
Start: 2019-11-05 — End: ?
  Administered 2019-11-05: 14:00:00 25 mEq via ORAL

## 2019-11-05 MED ORDER — PANTOPRAZOLE IV PUSH 40 MG VIAL & NS (ADULTS)
Freq: Two times a day (BID) | INTRAVENOUS | Status: DC
Start: 2019-11-05 — End: 2019-11-05
  Administered 2019-11-05: 14:00:00 10.000 mL via INTRAVENOUS

## 2019-11-05 MED ORDER — HEPATITIS B VIRUS VACCINE RECOMB (PF) 20 MCG/ML INTRAMUSCULAR SYRINGE
20 mcg/mL | Freq: Once | INTRAMUSCULAR | Status: CP
Start: 2019-11-05 — End: ?
  Administered 2019-11-06: 01:00:00 20 mL via INTRAMUSCULAR

## 2019-11-05 MED ORDER — FOLIC ACID 5 MG/ML INJECTION SOLUTION
5 mg/mL | Freq: Once | INTRAVENOUS | Status: CP
Start: 2019-11-05 — End: ?
  Administered 2019-11-05: 14:00:00 5 mL via INTRAVENOUS

## 2019-11-05 MED ORDER — PANTOPRAZOLE 40 MG TABLET,DELAYED RELEASE
40 mg | Freq: Two times a day (BID) | ORAL | Status: DC
Start: 2019-11-05 — End: 2019-11-05

## 2019-11-05 MED ORDER — INSULIN U-100 REGULAR HUMAN 100 UNIT/ML INJECTION SOLUTION
100 unit/mL | Freq: Once | INTRAVENOUS | Status: DC
Start: 2019-11-05 — End: 2019-11-05

## 2019-11-05 NOTE — Plan of Care
Plan of Care Overview/ Patient StatusPatient remains intubated and on ordered ventilator settings. Mouthcare completed. Suctioning moderate amount of thick tan secretions. sample collected and sent for culture. Will continue to monitor.

## 2019-11-05 NOTE — Progress Notes
[facility]	General Medicine Progress NotePatient Data:  Patient Name: Melinda George Age: 69 y.o. DOB: 25-May-1951	 MRN: [id number]	 Hospital Medicine Progress Cornelious Bryant, MDSubjective: Patient is seen and examined at bedside. Patient is not in acute distress.  MEDS:  I have reviewed Medications & Allergy ListCurrent Facility-Administered Medications Medication Dose Route Frequency Provider Last Rate Last Admin ? cefTRIAXone (ROCEPHIN) 1 g in water for injection, sterile 10 mL (100 mg/mL)  1 g IV Push Q24H Ferdie Ping, MD   1 g at 11/04/19 1659 ? chlorhexidine gluconate (PERIDEX) 0.12 % solution 15 mL  15 mL Mouth/Throat Q12H Ferdie Ping, MD   15 mL at 11/04/19 2005 ? escitalopram oxalate (LEXAPRO) tablet 20 mg  20 mg Per NG tube Daily Alyse Low, APRN     ? [START ON 11/06/2019] folic acid (FOLVITE) tablet 1 mg  1 mg Oral Daily Main, Marnee Spring, MD     ? folic acid syringe 1 mg  1 mg IV Push Daily Main, Marnee Spring, MD   1 mg at 11/04/19 0865 ? ipratropium-albuteroL (DUO-NEB) 0.5 mg-3 mg(2.5 mg base)/3 mL nebulizer solution 3 mL  3 mL Nebulization Q12H Ferdie Ping, MD   3 mL at 11/05/19 0833 ? metoprolol tartrate (LOPRESSOR) Immediate Release tablet 50 mg  50 mg Per NG tube BID Alyse Low, APRN   50 mg at 11/04/19 2005 ? ondansetron (PF) (ZOFRAN) injection 4 mg  4 mg IV Push Once Nicholes Stairs, MD     ? pantoprazole (PROTONIX) EC tablet 40 mg  40 mg Oral Q12H Angelamarie Avakian, MD     ? potassium bicarbonate-citric acid (EFFER-K, K-LYTE) effervescent tablet for oral solution 50 mEq  50 mEq Oral Once Ferdie Ping, MD     ? sodium chloride 0.9 % flush 3 mL  3 mL IV Push Q8H Main, Marnee Spring, MD   3 mL at 11/04/19 2006 ? thiamine (VITAMIN B1) injection 100 mg  100 mg IV Push Daily Main, Marnee Spring, MD   100 mg at 11/04/19 7846 ? [START ON 11/06/2019] thiamine (VITAMIN B1) tablet 100 mg  100 mg Oral Daily Main, Marnee Spring, MD     ? vecuronium (NORCURON) 10 mg injection          .Past Medical History: Diagnosis Date ? Alcohol abuse, in remission 12/15/2011 ? Alcohol dependence (HC Code)  ? Body mass index 39.0-39.9, adult 06/02/2013 ? Depressive disorder 12/21/2009 ? Essential hypertension 12/21/2009  CONT CURRENT MEDS, CR AUG 2014 0.9 ? Essential tremor 08/22/2010 ? Gastroesophageal reflux disease 12/21/2009 ? Generalized anxiety disorder 12/21/2009 ? Hyperlipidemia 12/21/2009 ? Obesity 05/28/2013 ? Rosacea 12/21/2009 ? Sprain of ankle 04/30/2010 Physical Exam:Temp:  [97.6 ?F (36.4 ?C)-99.6 ?F (37.6 ?C)] 99.3 ?F (37.4 ?C)Pulse:  [77-133] 83Resp:  [16-21] 16BP: (113-144)/(58-92) 129/61SpO2:  [94 %-100 %] 96 %Intake/Output Summary (Last 24 hours) at 11/05/2019 0911Last data filed at 11/05/2019 0700Gross per 24 hour Intake 3537.71 ml Output 1695 ml Net 1842.71 ml Constitutional: intubated and sedated.Appears well-developed and well-nourished. HENT: Head: Normocephalic and atraumatic. Eyes: Conjunctivae and EOM are normal. Neck: No JVDCardiovascular: Normal rate and regular rhythm. Pulmonary/Chest: Effort normal and breath sounds normal. Abdominal: Soft.  normal bowel sounds. No tenderness. No rigidity.  No rebound tenderness.  mild to moderate distensionMusculoskeletal:  Exhibits no deformity. Neurological:  limited as patient is intubated and sedatedSkin: Skin is warm and dry. Psychiatric:  limited as patient is intubated and sedatedWaking up. Moving 4 extremities. purposeful movements. Data:Recent Labs Lab 03/17/210406 03/17/210851 03/18/210555  03/18/210606 03/19/210600 NA 138  --  142  --  144 K 4.8  --  4.0  --  3.3* CL 103  --  109*  --  115* CO2 22  --  24  --  23 BUN 19*  --  23*  --  11 CREATININE 1.18*  --  1.34*  --  1.04* GLU 121*  --  138*  --  104 CALCIUM 8.8  --  7.9*  --  7.7* MG  --  1.7 1.7  --   --  PHOS  --  3.8  --  3.3  --   Recent Labs Lab 03/17/210406 03/18/210555 ALBUMIN 3.0* 2.6* PROT 6.5 5.5* BILITOT 0.4 0.5 ALKPHOS 96 68 ALT 75 45 AST 82* 34 Recent Labs Lab 03/17/211612 03/18/210554 03/18/212012 03/19/210600 WBC 5.15 6.38  --  5.66 HGB 9.8* 8.6* 9.0* 9.1* HCT 29.0* 26.6* 28.2* 28.0* PLT 159 160  --  155 Recent Labs Lab 03/17/210406 INR 1.0 Recent Labs Lab 03/17/211201 LABBLOO No Growth to Date - No Growth to Date Recent Labs Lab 03/17/211348 LABURIN >=100,000 CFU/mL Gram Negative Rods, Lactose Fermenting* No results found for this or any previous visit (from the past 1008 hour(s)).I have reviewed patients Radiology images/reports.  Assessment/Plan:GI bleed. Hematemesis Required intubation to protect airwayEGD showed non bleeding peptic ulcerColonoscopy showed possible diverticular bleedSwitch Protonix from IV to p.o.Will start tube feeds.  Follow-up sputum cultureAnemia 2/2 abovetransfusing 1 U 3/18, with acceptable post transfusion CBC.protonix bidHypokalemiaRepleteBorderline hypocalcemiaWill need vitamin D to be checked in outpatientUTIUA showing Gram Negative Rods, Lactose FermentingKeep on rocephin for now. Lactic acidosisresolved Alcohol disorderMonitor for withrawal. As patient drinks 1.5 L of white wine dailyThiamine and folic acidModerate/severe tremor2/2 essential tremorSees neurology in [employer] get blood today and will keep gentle hydration afterTrend BMP tomorrowMicroscopic hematuriaWill need follow up ua in outpatient. DVT prophylaxis: SCDCare/plan discussed with patient and bedside nurse, intensivist and GI.I have confirmed that the patient's advanced care plan is present or surrogate decision maker is listed in the patient's medical record.I have utilized all available immediate resources to obtain, update, or review the patient's current medications.Full Code/ACLSLength of visit 45 minutes, with >50% spent counseling and coordinating care.Discharge Plan:  >48h.Signed:Ferdie Ping, MD3/19/202111:25

## 2019-11-05 NOTE — Plan of Care
Plan of Care Overview/ Patient Status    Nutrition assessment completed. Estimated needs =  1300-1450 Kcals/day, 77-88 gm PRO/day and 1300-1450 ml/day fluid.Wt used for calculations = most recent documented weight of 104.4 kg used to estimate kcal and fluid needs; ideal body weight of 59.1 kg used to estimate protein needs. Nutrition dx = Inadequate oral intakePt currently intubated and sedated on the ICU. Discussed care with nursing and medical team - plan to initiate enteral nutrition. See recommendations below. Of note, pt with relatively stable weight history; receiving IVF @ 100 mL/hour and folic acid/thiamine supplementation; low potassium this morning. RD will closely monitor for enteral nutrition tolerance. Given pt's GI complications, recommend low residue tube feeding formula. - Initiate tube feeds of Osmolite 1.5 @ 35 mL/hour. This will provide 1260 kcals, 53 gm protein, and 640 mL free water. - Supplement tube feeds with ProSource BID. This will provide 120 kcals, 30 gm protein, and 60 mL free water (for dilution and water flushes). In total, this will provide 1380 kcals (100% estimated needs), 83 gm protein (100% estimated needs), and 700 mL fluids. GOAL:Enteral nutrition will meet >/= 75% of estimated nutrition needs. Intervention:- Initiate enteral nutrition as outlined above. - RD will monitor for enteral nutrition initiation and advancement, tolerance, weight trends, nutrition related lab values, and overall plan of care. Education needs assessed: None identified at this timeCultural needs assessed : None identified at this timeDischarge nutrition needs assessed: None identified at this time

## 2019-11-05 NOTE — Plan of Care
Problem: Adult Inpatient Plan of CareGoal: Plan of Care ReviewOutcome: Interventions implemented as appropriateGoal: Patient-Specific Goal (Individualized)Outcome: Interventions implemented as appropriateGoal: Absence of Hospital-Acquired Illness or InjuryOutcome: Interventions implemented as appropriateGoal: Optimal Comfort and WellbeingOutcome: Interventions implemented as appropriateGoal: Readiness for Transition of CareOutcome: Interventions implemented as appropriate Problem: InfectionGoal: Infection Symptom ResolutionOutcome: Interventions implemented as appropriate Problem: Alcohol WithdrawalGoal: Alcohol Withdrawal Symptom ControlOutcome: Interventions implemented as appropriate Problem: Skin Injury Risk IncreasedGoal: Skin Health and IntegrityOutcome: Interventions implemented as appropriate Problem: Fall Injury RiskGoal: Absence of Fall and Fall-Related InjuryOutcome: Interventions implemented as appropriate Problem: Restraint, Nonbehavioral (Nonviolent)Goal: Discontinuation Criteria AchievedOutcome: Interventions implemented as appropriateGoal: Personal Dignity and Safety MaintainedOutcome: Interventions implemented as appropriate Plan of Care Overview/ Patient Status    Pt remains intubated, tolerating vent settings. Sedated on prn morphine and versed gtt, titrated to maintain RASS -1. Increased agitation with stimulation. Pt is following commands. Low grade temps t max 99.3, lower resp culture obtained by RRT. Other VSS.

## 2019-11-05 NOTE — Progress Notes
[facility] [employer] [facility]	 Pulmonary Critical Care ICU Progress NotePatient's name:Melinda George; 69 y.o.[id number]; 1952-04-10ER date: 3/17/2021Admit date: 11/03/2019 Hospital Day: 2Today's date: 11/05/2019 Interim Events: 11/05/2019 Remains intubated, sedated on Versed gtt.  No acute events overnight.  Opens eyes and follows some commands.  She is tremulous at times.  Per RN, was attempting to get out of bed earlier.  Suspect withdrawal symptoms.  EGD 3/17 showing nonbleeding gastric ulcers, diffuse gastritis and esophageal varices without bleed.  No evidence of Mallory-Weiss tear.  Off octreotide.  Colonoscopy 3/18 in ICU demonstrating 2 mm polyp in the sigmoid colon.  Removed with cold biopsy forceps with good hemostasis.  Small and large diverticula in the sigmoid and distal descending colon.  No evidence of diverticular bleeding.  There was red, maroon-colored fluid in the sigmoid indicating bleeding distal to the splenic flexure and likely diverticular in origin.  planned today.HPI:69 year old female with history morbid obesity, alcohol dependence, HTN, HLD, GERD and depressive disorder.  Apparently consumed 1.5 liters of wine daily with an additional 750 mL or 3 bottles of wine daily.  She has been actively drinking for years, but did have a period of sobriety from 2007 to 2012 after a program at [place] [facility] facility.  She is currently involved in the RIA program with a therapist, psychiatrist and breathalyzer evaluations through the computer which apparently allows her to slowly decrease alcohol intake.  She has, however been on able to wean off alcohol.  She presented to the emergency room 11/03/2019 with complaints of nausea with retching as well as BRBPR.  Intubated in the emergency room for emergent EGD and airway protection.  Endoscopy/colonoscopy results as documented above. Current Medications: I have utilized all available immediate resources to obtain, update, or review the patient's current medications. AllergiesAllergies Allergen Reactions ? Lactose (Bulk) Rash ? Lanolin Rash ? Nickel Rash Scheduled:Current Facility-Administered Medications Medication Dose Route Frequency Provider Last Rate Last Admin ? cefTRIAXone (ROCEPHIN) 1 g in water for injection, sterile 10 mL (100 mg/mL)  1 g IV Push Q24H Ferdie Ping, MD   1 g at 11/04/19 1659 ? chlorhexidine gluconate (PERIDEX) 0.12 % solution 15 mL  15 mL Mouth/Throat Q12H Ferdie Ping, MD   15 mL at 11/04/19 2005 ? escitalopram oxalate (LEXAPRO) tablet 20 mg  20 mg Per NG tube Daily Alyse Low, APRN     ? [START ON 11/06/2019] folic acid (FOLVITE) tablet 1 mg  1 mg Oral Daily Main, Marnee Spring, MD     ? folic acid injection 1 mg  1 mg IV Push Once Alyse Low, APRN     ? ipratropium-albuteroL (DUO-NEB) 0.5 mg-3 mg(2.5 mg base)/3 mL nebulizer solution 3 mL  3 mL Nebulization Q12H Ferdie Ping, MD   3 mL at 11/05/19 0833 ? metoprolol tartrate (LOPRESSOR) Immediate Release tablet 50 mg  50 mg Per NG tube BID Alyse Low, APRN   50 mg at 11/04/19 2005 ? ondansetron (PF) (ZOFRAN) injection 4 mg  4 mg IV Push Once Nicholes Stairs, MD     ? pantoprazole (PROTONIX) 40 mg in sodium chloride 0.9% PF 10 mL (4 mg/mL)  40 mg IV Push Q12H Alyse Low, APRN     ? potassium bicarbonate-citric acid (EFFER-K, K-LYTE) effervescent tablet for oral solution 50 mEq  50 mEq Oral Once Ferdie Ping, MD     ? sodium chloride 0.9 % flush 3 mL  3 mL IV Push Q8H Main, Marnee Spring, MD  3 mL at 11/04/19 2006 ? thiamine (VITAMIN B1) tablet 100 mg  100 mg Oral Daily Alyse Low, APRN     ? vecuronium (NORCURON) 10 mg injection          Infusions:? midazolam (VERSED) 100 mg in 100 mL (1 mg/mL) infusion 19 mg/hr (11/05/19 0641) ? sodium chloride 100 mL/hr (11/05/19 0102) PRN Meds: albuterol, ipratropium-albuteroL, midazolam (PF), midazolam, morphine (ADULT), ondansetron, simethicone, sodium chlorideVital Signs:Temp:  [97.6 ?F (36.4 ?C)-99.6 ?F (37.6 ?C)] 99.3 ?F (37.4 ?C)Pulse:  [77-133] 83Resp:  [16-21] 16BP: (113-144)/(58-92) 129/61SpO2:  [94 %-100 %] 96 %Device (Oxygen Therapy): mechanical ventilator I/O last 3 completed shifts:In: 4537.7 [P.O.:1296; I.V.:2960.7; Blood:281]Out: [id number] [ZOXWR:6045; Other:200]Gross Totals (Last 24 hours) at 11/05/2019 0949Last data filed at 11/05/2019 0700Intake 3537.71 ml Output 1695 ml Net 1842.71 ml Last 6 Weights  11/03/19 0335 11/03/19 1000 11/03/19 1432 Weight (lbs): 231.26 Lbs. 230.16 Lbs. 230.16 Lbs. Vent Settings/oxygen settings:Vent mode: PRVC A/C (11/05/19 0830)Rate (bpm): 16 bpm (11/05/19 0830)Vt (mL): 450 mL (11/05/19 0830)PEEP/CPAP (cmH2O): 5 cmH20 (11/05/19 0830)Oxygen Concentration (%): 40 (11/05/19 0839)Peak Inspiratory Pressure (cm H2O) : 20 (11/05/19 0830)Plateau Pressure (cm H2O): 17 (11/05/19 0830)Static Compliance (mL/cm H2O): 44 (11/05/19 0830)  Physical Examination: General Opens eyes to verbal.  Following some commands.  Occasional tremors, agitation   HEENT Sclera mildly icteric.Neck:  Full neck.  Trachea midline.ETT: Yes [x]  No []  Pupil size  2 mm Respiratory  Diminished bilateral bases, clear.  No crackles, wheeze or rhonchi. Extremity  Trace edema. Pulses 2+ bilaterally. CV Regular without murmurs, gallops, or heave Neuro sedated, intubatedFollows commands? Yes [x]  No []  GI/GU Soft, non-tender, normoactive bowel soundsG tube: Yes [x]  No [] Foley: Yes [x]  No []  Skin No rashes     Lines/Tubes: Patient Lines/Drains/Airways Status  Active PICC Line / CVC Line / PIV Line / Drain / ART Line / NG/OG Tube / Catheter   Name:   Placement date:   Placement time:   Site:   Days: Periph IV-single(adult) 11/03/19 0401 median cubital(antecubital fossa), right 20 gauge Nurse   11/03/19    0401    median cubital(antecubital fossa), right   1  Periph IV-single(adult) 11/03/19 1030 dorsum right arm over-the-needle catheter system 20 gauge Nurse   11/03/19    1030    dorsum right arm   1  Urethral Catheter 11/03/19 1455 16 10   11/03/19    1455     1  Naso/Oral Tube 11/03/19 1530 [place] sump 16 left nostril   11/03/19    1530     1    Laboratory Data: Labs Heme       - CBCRecent Labs Lab 03/17/211612 03/18/210554 03/18/212012 03/19/210600 WBC 5.15 6.38  --  5.66 HGB 9.8* 8.6* 9.0* 9.1* HCT 29.0* 26.6* 28.2* 28.0* PLT 159 160  --  155  Heme         - CoagsRecent Labs Lab 03/17/210406 INR 1.0  Renal Recent Labs Lab 03/17/210406 03/17/210851 03/18/210555 03/18/210606 03/19/210600 NA 138  --  142  --  144 K 4.8  --  4.0  --  3.3* CL 103  --  109*  --  115* CO2 22  --  24  --  23 BUN 19*  --  23*  --  11 CREATININE 1.18*  --  1.34*  --  1.04* CALCIUM 8.8  --  7.9*  --  7.7* MG  --  1.7 1.7  --   --  PHOS  --  3.8  --  3.3  --  GI/GU       - LFTsRecent Labs Lab 03/17/210406 03/18/210555 ALT 75 45 AST 82* 34 ALKPHOS 96 68 BILITOT 0.4 0.5 BILIDIR <0.10  --      - LIPASENo results for input(s): LIPASE in the last 168 hours.      - Recent Labs Lab 03/17/210851 AMMONIA 31  CV    - Cardiac EnzymesNo results for input(s): TROPONINI, CKTOTAL, CKMB in the last 168 hours.Invalid input(s): TROPONINPOC     pBNPNo components found for: PROBNP Endocrine   - No results for input(s): TSH in the last 168 hours.     - No results found for: FREET4  Endocrine    - Recent Labs Lab 03/17/210406 03/18/210555 03/19/210600 GLU 121* 138* 104  Endocrine -  No results found for: HGBA1C   ID  Recent Labs Lab 03/17/210848 FERRITIN 271 Pending Labs: Pending Lab Results   Order Current Status  Pathology [facility]) Collected (11/04/19 1621)  Hepatitis A Ab, total w/refl IgM In process  Hepatitis B core antibody, total In process  Hepatitis B surface antibody     ([facility]) In process  Hepatitis B surface antigen w/refl confirm-check     (LMW Q) In process  Hepatitis C Ab with reflex to HCV PCR In process  Lower respiratory culture In process  Lower respiratory culture, qualitative In process  Pathology ([facility]) In process  Vitamin D, 25-hydroxy In process  Blood culture Preliminary result  Blood culture Preliminary result     ABG Data:Recent Labs Lab 03/17/211539 03/17/211845 03/18/210537 PHART 7.41 7.41 7.41 PCO2ART 41 41 38 PO2ART 36* 189* 81 HCO3ART 25.6 25.5 24.0 Respiratory Culture:Lab Results Component Value Date  SARSCOV2 Negative 11/03/2019 Microbiology:Blood cultures 3/17: NGTDUrine cultures:Results for orders placed or performed during the hospital encounter of 11/03/19 Urine culture  Collection Time: 11/03/19  1:48 PM  Specimen: Urine, Clean Catch Result Value  Urine Culture, Routine >=100,000 CFU/mL Klebsiella pneumoniae (A)     Susceptibility  Klebsiella pneumoniae -  (no method available)*   *  *    Amoxicillin + Clavulanate <=8 Susceptible ug/mL   Aztreonam <=4 Susceptible ug/mL   Cefazolin* <=2 Susceptible ug/mL    * For uncomplicated UTI caused by E.coli, K.pneumoniae, or P.mirabilis, results of cefazolin testing can be used to predict results to the following oral agents: cefdinir, cefuroxime axetil, and cephalexin.   Ceftriaxone <=1 Susceptible ug/mL   Cephalothin <=8 Susceptible ug/mL   Ciprofloxacin <=0.5 Susceptible ug/mL   Gentamicin <=1 Susceptible ug/mL   Imipenem <=1 Susceptible ug/mL   Levofloxacin <=1 Susceptible ug/mL   Nitrofurantoin <=32 Susceptible ug/mL Piperacillin <=16 Susceptible ug/mL   Piperacillin + Tazobactam <=8 Susceptible ug/mL   Tetracycline <=2 Susceptible ug/mL   Tigecycline <=1 Susceptible ug/mL   Trimethoprim + Sulfamethoxazole <=0.5 Susceptible ug/mL   * The reported antimicrobial agents are considered first line for the isolated pathogen; However, if relative or absolute contraindications exist to the reported antimicrobial agents please call the microbiology lab at [phone] for a report of other antimicrobials that were tested. Imaging Review:Chest x-ray 3/17:Endotracheal tube in satisfactory position. Nasogastric tube in the stomach. Cardiomegaly without congestive heart failure discrete confluent parenchymal consolidation.Chest x-ray 3/19:Small left basilar opacity with a questionable pleural effusion. Pneumonia could have this appearance. Clinical correlation and plain film follow up are recommended.?[place] scans:N/AOther Imaging:Abdominal ultrasound 3/19:  PendingEKGs:No results found for this or any previous visit. Serial CXR reviewed while in ICU/CCU CLINICAL IMPRESSION  Lailee  B Arens is a 69 y.o.female with PMH alcohol dependence, GERD, HTN, HLD, depressive disorder and morbid obesity.  Admitted with lactic acidosis, UTI, GIB.   ASSESSMENT AND PLAN 1.	Cardiovascular: ?	BP stable?	Back on beta-blocker to avoid reflex tachycardia (Lopressor 50 milligram b.i.d.,substituted for outpatient Bystolic dose)?	Tachycardia improved2. Pulmonary:?	Intubated 3/17?	ABG 3/18, 7.41/38/81/24.0?	Imaging as noted above, personally reviewed?	Low-grade fever 99.3?	Day 3 ceftriaxone?	Suctioning yellow, sputum sent?	Continue bronchodilators?	Keep intubated for withdrawal symptoms prior to consideration for extubation3. Neurological:        ?	Wernicke's prophylaxis?	Versed drip while intubated?	Phenobarb if needed 4. Gastrointestinal:  GI bleed?	EGD 3/17 showing gastric ulcers, diffuse gastritis and varices without bleed.  No evidence  Mallory-Weiss tear?	Off octreotide?	Colonoscopy results, probable diverticular bleed?	1 unit packed cells 3/18?	Hemoglobin stable?	Elevated AFP tumor marker?	Abdominal ultrasound, hepatitis serology pending?	IV Protonix b.i.d.5. Renal:  UTICr Cl in last 30 days: Serum creatinine: 1.04 mg/dL (H) 44/01/02 [id number] creatinine clearance: 48 mL/min (A) ?	Urine culture with growth of GNRs?	Ceftriaxone, day 3 ?	Creatinine improved, likely at baseline?	Net IO Since Admission: 4,380.66 mL [11/05/19 0949] 6. Infectious Disease:              Blood cultures 3/17, NGTD             Urine culture with growth of GNRs             No leukocytosis              Low-grade fever today             Short course ceftriaxone         7. FEN: ?	K:  3.3., goal > 4, repleted?	Mg:  Not measured, goal > 2?	Phosphorous:  Not measured?	Diet:  Tube feeds initiated, Jevity 1.2?	I personally have reviewed hold all the patient's morning labs8. Heme/Onc: ?	1 unit packed cells?	Hemoglobin stable9. Endo: ?	Stable fasting glucoseProcedural recommendations at this time (if any):  NoneCRITICAL HOME MEDICATIONS ON HOLD Lexapro (resumed)Bystolic (Lopressor substituted)NaltrexoneI have reviewed the patient's problem list and updated it as needed.Prophylaxis: DVT Prophylaxis: SCDsPUD Prophylaxis: protonixICU ppx: Aspiration precautions, HOB > 30 degreesEarly mobilization, out of bed if ableDispo/Social: Patient to remain in the MICU.Surrogate decision maker: Primary Emergency Contact: Mohl,George, Home Phone: [phone] confirmed that the patient's advanced care plan is present, code status is documented, or surrogate decisionmaker is listed in the patient's medical record. CODE status: Full Code/ACLS Primary Emergency Contact: Ruta,George, Home Phone: [phone] care statement: The patient has required 45  minutes of my undivided attention during the past 24 hours for one of the previously noted life threatening diagnoses. Alyse Low, APRNElectronically Signed by Alyse Low, APRN3/19/20212:56 PM

## 2019-11-05 NOTE — Plan of Care
Problem: Adult Inpatient Plan of CareGoal: Readiness for Transition of CareOutcome: Interventions implemented as appropriate Plan of Care Overview/ Patient Status    Patient intubated at this time in ICU. Left voicemail for husband to update on patients status and offer emotional support. Will continue to follow Addendum 3:38 pm - received returned telephone call from patients husband Greggory Stallion.  Greggory Stallion reports that needs not determined at this time as patient is intubated.  Greggory Stallion receptive to [employer] or [employer] for pateint when medically stable.  If patient needs STR, husband open to STR as well.  Will continue to follow CM Discharge Planning    Most Recent Value Discharge Planning Concerns to be Addressed  adjustment to diagnosis/illness, basic needs Barriers to Discharge  medical workup continued Patient/Patient Representative was presented with a list of choices of facilities, agencies and/or dme providers  They had no preference Referral(s) placed for:  None Home Health Care Services Required  N/A Patient is considered homebound due to: (he/she requires considerable and taxing effort to leave their residence for medical reasons or religious services OR infrequently OR of short duration for other reasons)  n/a Equipment Needed After Discharge  none Mode of Transportation   Private car  (add comment for special considerations) Designated Discharge Caregiver 1 Name designated caregiver  Lonia Chimera CM D/C Readiness PASRR completed and approved  N/A Authorization number obtained, if required  N/A Is there a 3 day INPATIENT Qualifying stay for Medicare Patients?  N/A DME Authorized/Delivered  N/A No needs identified/ follow up with PCP/MD  N/A Post acute care services secured W10 complete  Yes Finalized Plan Post acute care services secured W10 complete  Yes  Maxie Better RN, BSNCase ManagementPhone: [phone] Fax: [phone]

## 2019-11-05 NOTE — Plan of Care
Plan of Care Overview/ Patient Status    Uneventful day. Sedated for goal rass. Tube feed started. See flow sheets. Pt to be handed off.Problem: Adult Inpatient Plan of CareGoal: Plan of Care ReviewOutcome: Interventions implemented as appropriateGoal: Patient-Specific Goal (Individualized)Outcome: Interventions implemented as appropriateGoal: Absence of Hospital-Acquired Illness or InjuryOutcome: Interventions implemented as appropriateGoal: Optimal Comfort and WellbeingOutcome: Interventions implemented as appropriateGoal: Readiness for Transition of CareOutcome: Interventions implemented as appropriate Problem: InfectionGoal: Infection Symptom ResolutionOutcome: Interventions implemented as appropriate Problem: Alcohol WithdrawalGoal: Alcohol Withdrawal Symptom ControlOutcome: Interventions implemented as appropriate Problem: Skin Injury Risk IncreasedGoal: Skin Health and IntegrityOutcome: Interventions implemented as appropriate Problem: Fall Injury RiskGoal: Absence of Fall and Fall-Related InjuryOutcome: Interventions implemented as appropriate Problem: Restraint, Nonbehavioral (Nonviolent)Goal: Discontinuation Criteria AchievedOutcome: Interventions implemented as appropriateGoal: Personal Dignity and Safety MaintainedOutcome: Interventions implemented as appropriate

## 2019-11-05 NOTE — Progress Notes
Patient Data:  Patient Name: Melinda George Age: 69 y.o. DOB: 13-Aug-1951	 MRN: [id number]	 [facility]	 [facility]	Gastroenterology Progress NoteConsultation requested by: Attending Provider: Marquis Buggy, MD [phone] Date: 11/03/2019 Encounter Date: 03/19/21Subjective: Interim History: Melinda George is a 69 y.o. female Hospital Day 2, EGD 3/17, colonoscopy 3/18Remains intubated, no further evidence of GI bleeding hemoglobin and hematocrit stable.She remains on midazolam drip with prn bolus. Worry is ETOH related withdrawal, increasing amount of midazolam she has been tachy and has has some variable blood pressures she is on a beta blockerShe is on thiamin folate and will start tube feeding with intact digestive track and plan for few more days of intubation to get through the withdrawal from alcohol. NGT inserted 3/17 can be used for nutritionLow grade temp could be related to withdrawal , some yellow sputumHer vit D 16.5 will need replacementAFP 5.4 , mild elevation in ast on admit normalized with abstinence from alcohol Ultrasound with fatty liver and gallstones no signs of cholecystitisAKI improving 1.34 to 1.04Objective: Medications: :Lactose (bulk), Lanolin, and NickelPrior to Admission medications  Medication Sig Start Date End Date Taking? Authorizing Provider escitalopram oxalate (LEXAPRO) 20 MG tablet TAKE 1 TABLET BY MOUTH EVERY DAY 09/29/17  Yes Kingsley Plan, MD gabapentin (NEURONTIN) 300 MG capsule Take 300 mg by mouth 2 (two) times daily with breakfast and dinner. TAKE 1 OR 2 CAPSULES AT BEDTIME 11/03/17  Yes [provider] naltrexone (REVIA) 50 mg tablet Take 50 mg by mouth 2 (two) times daily. 10/18/19  Yes [provider] BYSTOLIC 20 mg tablet TAKE 1 TABLET BY MOUTH EVERY DAY 11/04/19   Kingsley Plan, MD lactobacillus rhamnosus, GG, (CULTURELLE) 10 billion cell capsule Take 1 capsule by mouth daily.    [provider] Current Facility-Administered Medications: ?  albuterol neb sol 2.5 mg/3 mL (0.083%) (PROVENTIL,VENTOLIN), 2.5 mg, Nebulization, Q4H PRN, Burgess Estelle, Ahmed, MD?  cefTRIAXone (ROCEPHIN) 1 g in water for injection, sterile 10 mL (100 mg/mL), 1 g, IV Push, Q24H, Elshazly, Ahmed, MD, 1 g at 11/04/19 1659?  chlorhexidine gluconate (PERIDEX) 0.12 % solution 15 mL, 15 mL, Mouth/Throat, Q12H, Elshazly, Ahmed, MD, 15 mL at 11/04/19 2005?  escitalopram oxalate (LEXAPRO) tablet 20 mg, 20 mg, Per NG tube, Daily, Alyse Low, APRN?  [START ON 11/06/2019] folic acid (FOLVITE) tablet 1 mg, 1 mg, Oral, Daily, Main, Marnee Spring, MD?  folic acid syringe 1 mg, 1 mg, IV Push, Daily, Main, Marnee Spring, MD, 1 mg at 11/04/19 8657?  ipratropium-albuteroL (DUO-NEB) 0.5 mg-3 mg(2.5 mg base)/3 mL nebulizer solution 3 mL, 3 mL, Nebulization, Q12H, Elshazly, Ahmed, MD, 3 mL at 11/04/19 2039?  ipratropium-albuteroL (DUO-NEB) 0.5 mg-3 mg(2.5 mg base)/3 mL nebulizer solution 3 mL, 3 mL, Nebulization, Q4H PRN, Burgess Estelle, Ahmed, MD, 3 mL at 11/04/19 1348?  metoprolol tartrate (LOPRESSOR) Immediate Release tablet 50 mg, 50 mg, Per NG tube, BID, Alyse Low, APRN, 50 mg at 11/04/19 2005?  midazolam (PF) (VERSED) injection 1 mg, 1 mg, IV Push, Q4H PRN, Burgess Estelle, Ahmed, MD?  midazolam (VERSED) 1 mg/mL in sodium chloride 0.9% infusion, 0.5-20 mg/hr, Intravenous, Continuous, Duhig, Myrene Buddy, MD, Last Rate: 19 mL/hr at 11/05/19 0641, 19 mg/hr at 11/05/19 0641?  midazolam (VERSED) bolus from bag 0.5 mg, 0.5 mg, Intravenous, Q30 MIN PRN, Burgess Estelle, Ahmed, MD, 0.5 mg at 11/05/19 0401?  morphine syringe 2 mg, 2 mg, IV Push, Q1H PRN, Margaretmary Eddy, Myrene Buddy, MD, 2 mg at 11/05/19 0421?  ondansetron (PF) (ZOFRAN) injection 4 mg, 4  mg, IV Push, Once, Nicholes Stairs, MD?  ondansetron (ZOFRAN-ODT) disintegrating tablet 4 mg, 4 mg, Oral, Q6H PRN, Main, Marnee Spring, MD?  pantoprazole (PROTONIX) 40 mg in sodium chloride 0.9% PF 10 mL (4 mg/mL), 40 mg, IV Push, Q12H, Elshazly, Ahmed, MD, 40 mg at 11/04/19 2005?  simethicone (MYLICON) 40 mg/0.6 mL drops 40 mg, 40 mg, Endoscopic, PRN, Glorious Flicker, Megan Mans, MD?  sodium chloride 0.9 % flush 3 mL, 3 mL, IV Push, Q8H, Main, Marnee Spring, MD, 3 mL at 11/04/19 2006?  sodium chloride 0.9 % flush 3 mL, 3 mL, IV Push, PRN for Line Care, Main, Marnee Spring, MD?  sodium chloride 0.9% infusion, 100 mL/hr, Intravenous, Continuous, Main, Marnee Spring, MD, Last Rate: 100 mL/hr at 11/05/19 0102, 100 mL/hr at 11/05/19 0102?  thiamine (VITAMIN B1) injection 100 mg, 100 mg, IV Push, Daily, Main, Marnee Spring, MD, 100 mg at 11/04/19 0981?  [START ON 11/06/2019] thiamine (VITAMIN B1) tablet 100 mg, 100 mg, Oral, Daily, Main, Marnee Spring, MD?  vecuronium (NORCURON) 10 mg injection, , , , Vitals:Temp:  [97.6 ?F (36.4 ?C)-99.6 ?F (37.6 ?C)] 99.3 ?F (37.4 ?C)Pulse:  [77-133] 89Resp:  [16-21] 16BP: (113-144)/(58-92) 129/61SpO2:  [94 %-100 %] 100 %I/O's:Gross Totals (Last 24 hours) at 11/05/2019 0748Last data filed at 11/05/2019 0700Intake 4537.71 ml Output 1695 ml Net 2842.71 ml Procedures: Physical Exam: GENERAL: quietly sedated this amHEENT:  Anicteric   NECK: Supple; no LNs; no JVD CHEST: Clear to auscultation anteriorly HEART: RRR, S1S2; ( HR 72) when I saw her ABDOMEN: Positive bowel sounds in all four quadrants, Soft, nontender. No organomegaly centrally obese  No tendernessEXTREMITIES:  No cyanosis, clubbing, or now some edemaNEUROLOGIC: with sedation no tremorLabs:Recent Labs Lab 03/17/210406 03/17/210406 03/17/211612 03/18/210554 03/18/210555 03/18/212012 03/19/210600 NA 138  --   --   --  142  --  144 K 4.8  --   --   --  4.0  --  3.3* CL 103  --   --   --  109*  --  115* CO2 22  --   --   --  24  --  23 BUN 19*  --   --   --  23*  --  11 CREATININE 1.18*  --   --   --  1.34*  --  1.04* CALCIUM 8.8 --   --   --  7.9*  --  7.7* PROT 6.5  --   --   --  5.5*  --   --  BILITOT 0.4  --   --   --  0.5  --   --  ALKPHOS 96  --   --   --  68  --   --  ALT 75  --   --   --  45  --   --  AST 82*  --   --   --  34  --   --  GLU 121*  --   --   --  138*  --  104 INR 1.0  --   --   --   --   --   --  WBC 6.50   < > 5.15 6.38  --   --  5.66 HGB 12.1   < > 9.8* 8.6*  --  9.0* 9.1* HCT 36.4   < > 29.0* 26.6*  --  28.2* 28.0* PLT 196   < > 159 160  --   --  155  < > = values in this  interval not displayed. Diagnostics:No results found.Assessment: Assessment: BROOKLIN RIEGER is a 69 y.o.  female Hospital day #2 with alcohol abuse with fatty liver related to alcohol and class 2 obesity. She remains intubated day #2 in anticipation for alcohol related withdrawalHer hemoglobin is stable and liver is improved off alcoholShe has gallstones with ultrasound showing no evidence of cholecystitisTube feeding beginning and nutrition support in placeHep C is negativeHep B ab 4.58  Need repeat vaccination Likely diverticula bleeding Plan: 1. Re vaccinate Hep B 2. Watch tube feeding 3. Watch Gallbladder with tube feeding 4. Await Hep A serology5. Decrease pantoprazole to once a dayI am off the weekend call if questions if need to be seen  Signed:Ann Groeneveld Jodelle Green, MDCell Preferred Contact: [phone] Contact: [phone] Fax:  [phone] Address:  [address]                           [place], [place] [address] 11/05/2019

## 2019-11-06 LAB — CBC WITH AUTO DIFFERENTIAL
BKR WAM ABSOLUTE IMMATURE GRANULOCYTES.: 0.08 x 1000/ÂµL (ref 0.00–0.10)
BKR WAM ABSOLUTE LYMPHOCYTE COUNT.: 1.05 x 1000/ÂµL (ref 1.00–4.50)
BKR WAM ABSOLUTE NEUTROPHIL COUNT.: 4.06 x 1000/ÂµL (ref 1.40–6.60)
BKR WAM BASOPHIL ABSOLUTE COUNT.: 0.05 x 1000/ÂµL (ref 0.0–0.3)
BKR WAM BASOPHILS: 0.8 % (ref 0.0–3.0)
BKR WAM EOSINOPHIL ABSOLUTE COUNT.: 0.17 x 1000/ÂµL (ref 0.00–0.45)
BKR WAM EOSINOPHILS: 2.6 % (ref 0.0–4.0)
BKR WAM HEMATOCRIT: 28.2 % — ABNORMAL LOW (ref 34.0–45.0)
BKR WAM HEMOGLOBIN: 9 g/dL — ABNORMAL LOW (ref 11.0–15.0)
BKR WAM IMMATURE GRANULOCYTES: 1.2 % — ABNORMAL HIGH (ref 0.0–1.0)
BKR WAM LYMPHOCYTES: 16.2 % — ABNORMAL LOW (ref 25.0–45.0)
BKR WAM MCH PG: 31.5 pg (ref 27–33)
BKR WAM MCHC: 31.9 g/dL — ABNORMAL LOW (ref 32.0–36.0)
BKR WAM MCV: 98.6 fL (ref 79.0–99.0)
BKR WAM MONOCYTE ABSOLUTE COUNT.: 1.07 x 1000/ÂµL (ref 0.00–1.20)
BKR WAM MONOCYTES: 16.5 % — ABNORMAL HIGH (ref 0.0–12.0)
BKR WAM MPV: 8.2 fL (ref 7.5–11.5)
BKR WAM NEUTROPHILS: 62.7 % (ref 36.0–66.0)
BKR WAM NUCLEATED RED BLOOD CELLS: 0 % (ref 0.0–5.0)
BKR WAM PLATELETS: 139 x1000/ÂµL — ABNORMAL LOW (ref 140–400)
BKR WAM RDW-CV: 13.6 % (ref 11.5–14.5)
BKR WAM RED BLOOD CELL COUNT.: 2.86 M/ÂµL — ABNORMAL LOW (ref 4.00–5.20)
BKR WAM WHITE BLOOD CELL COUNT.: 6.48 x1000/ÂµL (ref 4.00–10.00)

## 2019-11-06 LAB — BLOOD GAS, ARTERIAL     (LMW)
BKR AA GRADIENT LM: 133 mmHg
BKR FIO2: 35 %
BKR HCO3, ARTERIAL: 23.2 mmol/L (ref 22.0–28.0)
BKR PCO2 ARTERIAL: 41 mmHg (ref 35–45)
BKR PH, ARTERIAL: 7.36 U (ref 7.35–7.45)
BKR PO2, ARTERIAL: 65 mmHg — ABNORMAL LOW (ref 80–100)

## 2019-11-06 LAB — BASIC METABOLIC PANEL
BKR ANION GAP (LM): 8 mmol/L (ref 5–15)
BKR BLOOD UREA NITROGEN: 10 mg/dL (ref 7–18)
BKR CALCIUM: 7.8 mg/dL — ABNORMAL LOW (ref 8.5–10.1)
BKR CHLORIDE: 116 mmol/L — ABNORMAL HIGH (ref 98–107)
BKR CO2: 20 mmol/L — ABNORMAL LOW (ref 21–32)
BKR CREATININE: 0.91 mg/dL (ref 0.55–1.02)
BKR EGFR (AFR AMER) (LMC): >60 mL/min/{1.73_m2} (ref >60)
BKR EGFR (NON AFR AMER) (LMC): >60 mL/min/1.73m2 (ref >60)
BKR GLUCOSE: 100 mg/dL (ref 65–110)
BKR POTASSIUM: 3.4 mmol/L — ABNORMAL LOW (ref 3.5–5.1)
BKR SODIUM: 144 mmol/L (ref 136–145)

## 2019-11-06 LAB — MAGNESIUM: BKR MAGNESIUM: 1.9 mg/dL (ref 1.6–2.6)

## 2019-11-06 LAB — PHOSPHORUS     (BH GH L LMW YH): BKR PHOSPHORUS: 1.8 mg/dL — ABNORMAL LOW (ref 2.5–4.9)

## 2019-11-06 MED ORDER — PHENOBARBITAL SODIUM 130 MG/ML INJECTION SOLUTION
130 mg/mL | Status: DC
Start: 2019-11-06 — End: 2019-11-08

## 2019-11-06 MED ORDER — PHENOBARBITAL SODIUM 130 MG/ML INJECTION SOLUTION
130 mg/mL | Freq: Two times a day (BID) | INTRAVENOUS | Status: DC
Start: 2019-11-06 — End: 2019-11-07
  Administered 2019-11-06 – 2019-11-07 (×2): 130 mL via INTRAVENOUS

## 2019-11-06 MED ORDER — MORPHINE 2 MG/ML INJECTION SYRINGE
2 mg/mL | Status: DC
Start: 2019-11-06 — End: 2019-11-08

## 2019-11-06 MED ORDER — CHOLECALCIFEROL (VITAMIN D3) 10 MCG (400 UNIT) TABLET
10400 mcg (400 unit) | Freq: Every day | ORAL | Status: DC
Start: 2019-11-06 — End: 2019-11-09
  Administered 2019-11-07 – 2019-11-09 (×2): 10 mcg (400 unit) via ORAL

## 2019-11-06 MED ORDER — POTASSIUM, SODIUM PHOSPHATES 280 MG-160 MG-250 MG ORAL POWDER PACKET
280-160-250 mg | Freq: Once | ORAL | Status: CP
Start: 2019-11-06 — End: ?
  Administered 2019-11-06: 16:00:00 280-160-250 mg via ORAL

## 2019-11-06 MED ORDER — MORPHINE 4 MG/ML INTRAVENOUS SOLUTION
4 mg/mL | INTRAVENOUS | Status: DC | PRN
Start: 2019-11-06 — End: 2019-11-07
  Administered 2019-11-06 (×2): 4 mL via INTRAVENOUS

## 2019-11-06 MED ORDER — POTASSIUM, SODIUM PHOSPHATES 280 MG-160 MG-250 MG ORAL POWDER PACKET
280-160-250 mg | Freq: Four times a day (QID) | NASOGASTRIC | Status: DC
Start: 2019-11-06 — End: 2019-11-08
  Administered 2019-11-06 – 2019-11-08 (×4): 280-160-250 mg via NASOGASTRIC

## 2019-11-06 NOTE — Plan of Care
Plan of Care Overview/ Patient Status    SOCIAL WORK ASSESSMENTPatient Name: Melinda George Record Number: [id number] Date of Birth: [birth year]Social Work Assessment Adult    Most Recent Value Rendered Accommodations (Leave blank if none rendered or patient supplied their own hearing devices/glasses) Other language interpreter used (non-ASL)?  No Admission Information Document Type  Clinical  Assessment (For Inpatient/ED Only) Prior psychosocial assessment has been documented within this hospitalization  No (For Inpatient/ED Only) Prior psychosocial assessment has been documented within 30 days of this hospitalization  No Reason for Encounter  Symptoms/Risk of Misuse of Intoxicants Visitor Restriction in Place  No Intervention  Substance Use Assessment & Referral Substance Use Assessment & Referral  Alcohol abuse assessment Source of Information  Medical Team Medical Team Comment  currently intubated Record Reviewed  Yes Level of Care  Inpatient Assessment has been completed within 30 days of this encounter (For Inpatient/ED Only) Prior psychosocial assessment has been documented within 30 days of this hospitalization  No Patients Legal Contacts Legal Custody Status  Self    Custody Status Comment  not applicable Past/Current Department of Children & Families Involvement  No Legal Admission Status  None    Legal Admission Status Comment   not applicable Legal/Judicial Status  None    Legal/Judicial Status Comment  not applicable Criminal Activity/Legal Involvement Pertinent to Current Situation/Hospitalization  not applicable Currently on Probation / Parole?  No Pending Court Dates, Technical sales engineer Charges  not applicable Legal Contact(s)   none Probate Court Granted Conservatorship  No Conservator Notified of Admission  No Advance Directive Has Advance Directive?  Yes, Requested Copy Type of Healthcare Directive Living will, Healthcare Power of Attorney Most current copy in chart?  No Date Advance Directive Requested Date  11/03/19 Patient expressed wishes  No Verbalized wishes r/t  Advance Directive  pt's husband will bring it  in Advance Directive (Medical Healthcare)  yes Advanced Directive Comment  pt's husband will bring it in Legal Permission Granted to Share Information   -- [unable to assess] Involuntary Medication Hearing Will the patient have an Involuntary Medication Hearing?  No Language needed  None, Patient Speaks English Current Providers  Current Providers M-Z  Primary Care Physician Primary Care Physician Provider Name  Dr, Rosary Lively Primary Care Physician Phone Number  [phone] Relationships Temporary Family Living Arrangements (While Hospitalized)  staying with family Marital Status  Married Significant Relationships  Spouse Quality of Family Relationships  Unable to assess Support System  Unable to assess Separation/Losses (recent):  -- [unable to assess] Lives With  Unable to assess Sources of Support  unable to assess Sexual History  heterosexual female Need for Family Participation Comment  unable to assess Abuse Screen (yes response referral indicated) Able to respond to abuse questions  No Do you Feel That You Are Treated Well By Your Partner/Spouse/Family Member/Caregiver/Employer?   unable to assess Feels Unsafe at Home or Work/School  unable to answer (comment required) Feels Threatened by Someone  unable to answer (comment required) [intubated] Does Anyone Try to Keep You From Having Contact with Others or Doing Things Outside Your Home?  unable to answer (comment required) Physical Signs of Abuse Present  no Physical Indicators of Abuse  No evidence of physical abuse Mandated Referral Mandated Report Required  no Physical/Sexual Abuse History History of personal victimization  Unable to assess Physical/Sexual Abuse Victimization  unable to assess Physical/Sexual Abuse Perpetration  unable to assess Other Sexually Reactive Behaviors  unable to assess Education - Adult Education -  Adult  Unable to assess Current Education Enrollment  Unable to assess Literacy  Unable to assess Special Needs  unable to assess Employment/Income/Finance/Insurance Military Experience  Unable to assess Financial Concerns Identified  Unable to Assess Financial Barriers to accessing medical care   -- [unable to assess] Employment Status  Deferred Housing / Programmer, multimedia for the past 2 months  Deferred Housing-Related Financial Concerns  Unable to assess Able to Return to Prior Arrangements  unable to assess Able to Receive Visiting Nurse at Prior Living Arrangement  Unable to assess Housing-Related Environmental Concerns  Unable to assess Has utility company threatened to shut off services?  -- [unable to assess] Food Availability  -- [unable to assess] Are you dependent upon healthcare-related transportation?  -- [unable to assess] Do you have any transportation related concerns that impact your ability to take care of yourself?  -- [unable to assess] Patient is accruing charges in a parking lot related to this hospitalization?  No Transportation Comment  not applicable Mental Status Observation of Mental Status has identified Notable Findings  Unable to assess    Patient's acceptance of treatment  unable to assess    Environmental resources facilitating recovery  unable to assess    Environmental obstacles inhibiting recovery  unable to assess Suicide Risk Assessment Reason for Assessment Utilizing SAFE-T and C-SSRS (Check all that apply)  Social Work Consult/Assessment C-SSRS  Unable to Assess Specific Questions about Thoughts, Plans, Suicidal Intent (SAFE-T)  Unable to Assess Risk Assessment Risk Assessment  Unable to Assess Risk to Self Unable to Assess Risk to Others  Unable to Assess Current and Past Psychiatric Diagnoses  Unable to Assess Presenting Symptoms  Unable to Assess Family History  Unable to Assess Precipitants/Stressors  Unable to Assess Change in Mental Health or Substance Use Disorder Treatment  Unable to Assess Historical Risk Factors  Unable to Assess Protective Factors  Unable to Assess This patient was screened using the [place] Suicide Severity Rating Scale (CSSRS)   No This patient was not screened using the [place] Suicide Severity Rating Scale (CSSRS)   patient is intubated, unable to assess Cause for concern  Unable to Assess Based on my assessment, the level of risk for this patient to suicide in an inpatient or emergency setting is:   -- [unable to assess] Based on my assessment, the level of risk for this patient to suicide in the community is:   -- [unable to assess] Alcohol Use Alcohol Use  Unable to Assess Concerns for Alcohol Abuse  -- [unable to assess] Alcohol Amount Per Day In Past Year  -- [unable to assess] Substance Use Active substance abuse  Unable to assess Substances Used  Unable to assess Exposure to Second Hand Smoke  other (see comments) [unable to assess] Previous Substance Use Treatment  -- [unable to assess] Response to previous treatment / Relapse history  unable to assess    Patient's acceptance of treatment  unable to assess    Environmental resources facilitating recovery  unable to assess    Environmental obstacles inhibiting recovery  unable to assess    Criminal Activity/Legal Activity Affecting Recommended Treatment  unable to assess Substance Use, Caregiver Caregiver Substance Use  Unable to Assess FICA Spiritual Assessment Tool Need for spiritual support   No Spiritual Care Comment  unable to assess Functional Status Prior Ambulation  0 - independent Transferring  0 - independent Toileting  0 - independent Bathing  0 - independent Dressing  0 - independent Eating  0 - independent Communication  0-->understands/communicates without difficulty Swallowing  0 - swallows foods/liquids without difficulty Coping/Stress - Caregiver Techniques to Cope with Loss/Stress/Change  counseling, support group, substance use Needs Assessment  Concerns to be Addressed  mental health, substance/tobacco abuse/use Readmission Within the Last 30 Days  no previous admission in last 30 days Needs in the Community  substance use/abuse, psychiatric illness Anticipated Facility/Agency/Outpatient/Support Group Need(s)   inpatient substance abuse treatment facility Equipment Needed After Discharge  none Discharge Plan Post acute care services secured W10 complete  Yes Patient/Patient Representative was presented with a list of choices of facilities, agencies and/or dme providers  They had no preference Patient is considered homebound due to: (he/she requires considerable and taxing effort to leave their residence for medical reasons or religious services OR infrequently OR of short duration for other reasons)  n/a Narrative/Signoff Identified Clinical/Disposition, Issues/Barriers:  Mrs. Mongillo presented to the Emergency Department nauseous, vomiting, and bleeding from her rectum. Per H&P she has been working on stopping drinking through a [place] based program called [facility], which includes a therapist, psychiatrist and breathalyzer Intervention(s)/Summary  10 minutes spent face to face with Mrs. Latona, who is currently intubated and unable to participate in an assessment. Per H&P Mrs. Perea has a long history of alcohol abuse, and reportedly has been drinking 3 bottles of wine or more daily. In addition to medical meds, Mrs. Arras is prescribed Lexapro 20 mg and Naltrexone 50mg  2 x day. Collaboration with Treatment Team/Community Providers/Family:  review with treatment team as above Referral(s) placed for: None Outcome  Ongoing Interventions Handoff Required?  Yes Barriers to Discharge  medical workup continued Next Steps/Plan (including hand-off):  meet with Mrs. Krauter when she is able to participate in an interview, and assist her in making a discharge plan that will support her sobriety, should she wish to do so. Do you intend to allow this note to be shared with your patient?  Yes Signature:  Roxanna Mew, LICSW Contact Information:  [phone]

## 2019-11-06 NOTE — Plan of Care
Problem: Adult Inpatient Plan of CareGoal: Plan of Care ReviewOutcome: Interventions implemented as appropriateGoal: Patient-Specific Goal (Individualized)Outcome: Interventions implemented as appropriateGoal: Absence of Hospital-Acquired Illness or InjuryOutcome: Interventions implemented as appropriateGoal: Optimal Comfort and WellbeingOutcome: Interventions implemented as appropriateGoal: Readiness for Transition of CareOutcome: Interventions implemented as appropriate Problem: InfectionGoal: Infection Symptom ResolutionOutcome: Interventions implemented as appropriate Problem: Alcohol WithdrawalGoal: Alcohol Withdrawal Symptom ControlOutcome: Interventions implemented as appropriate Problem: Skin Injury Risk IncreasedGoal: Skin Health and IntegrityOutcome: Interventions implemented as appropriate Problem: Fall Injury RiskGoal: Absence of Fall and Fall-Related InjuryOutcome: Interventions implemented as appropriate Problem: Restraint, Nonbehavioral (Nonviolent)Goal: Discontinuation Criteria AchievedOutcome: Interventions implemented as appropriateGoal: Personal Dignity and Safety MaintainedOutcome: Interventions implemented as appropriate Plan of Care Overview/ Patient Status    Report/ bedside review performed with Missy Sabins , care assumed. Patient  remains orally intubated, sedation regimen ongoing, day #3 since last alcohol intake.  Patient awake, follows simple instruction,  nods head appropriately.  Enteral feeds initiated 3/19 increased to goal 23ml/hr scant residual. Lines remain patent and intact/ restraints remain in use. Optimal comfort/ well being and dignity maintained in the delivery of care.

## 2019-11-06 NOTE — Plan of Care
Problem: Adult Inpatient Plan of CareGoal: Readiness for Transition of CareOutcome: Interventions implemented as appropriate Plan of Care Overview/ Patient Status    Patient intubated.  Telephone call to husband to update that patient may be extubated tomorrow if continues to wean successfully.  Husband grateful for update and will be in to visit patient later this afternoon. CM Discharge Planning    Most Recent Value Discharge Planning Concerns to be Addressed  adjustment to diagnosis/illness, basic needs Barriers to Discharge  medical workup continued Patient/Patient Representative was presented with a list of choices of facilities, agencies and/or dme providers  They had no preference Referral(s) placed for:  None Home Health Care Services Required  N/A Patient is considered homebound due to: (he/she requires considerable and taxing effort to leave their residence for medical reasons or religious services OR infrequently OR of short duration for other reasons)  n/a Equipment Needed After Discharge  none Mode of Transportation   Private car  (add comment for special considerations) Designated Discharge Caregiver 1 Name designated caregiver  Lonia Chimera CM D/C Readiness PASRR completed and approved  N/A Authorization number obtained, if required  N/A Is there a 3 day INPATIENT Qualifying stay for Medicare Patients?  N/A DME Authorized/Delivered  N/A No needs identified/ follow up with PCP/MD  N/A Post acute care services secured W10 complete  Yes Finalized Plan Post acute care services secured W10 complete  Yes  Maxie Better RN, BSNCase ManagementPhone: [phone]: [email or web address]

## 2019-11-06 NOTE — Progress Notes
[facility] [employer] [facility]	 Pulmonary Critical Care ICU Progress NotePatient's name:Melinda George; 69 y.o.[id number]; October 26, 1952ER date: 3/17/2021Admit date: 11/03/2019 Hospital Day: 3Today's date: 11/06/2019 Interim Events: 11/06/2019 Remains intubated, sedated on Versed gtt.  No acute events overnight.  Has been opening eyes and following commands. Smiles at times.  SBT on light sedation this morning at 5/5.  Did well.  Not extubated for concerns related to ongoing alcohol withdrawal.  EGD 3/17  showing nonbleeding gastric ulcers, diffuse gastritis and esophageal varices without bleed.  No evidence of Mallory-Weiss tear.  Off octreotide.  Colonoscopy 3/18 in ICU demonstrating 2 mm polyp in the sigmoid colon.  Removed with cold biopsy forceps with good hemostasis.  Small and large diverticula in the sigmoid and distal descending colon.  No evidence of diverticular bleeding.  There was red, maroon-colored fluid in the sigmoid indicating bleeding distal to the splenic flexure and likely diverticular in origin.  HPI: 69 year old female with history morbid obesity, alcohol dependence, HTN, HLD, GERD and depressive disorder.  Apparently consumed 1.5 liters of wine daily with an additional 750 mL or 3 bottles of wine daily.  She has been actively drinking for years, but did have a period of sobriety from 2007 to 2012 after a program at [place] [facility] facility.  She is currently involved in the RIA program with a therapist, psychiatrist and breathalyzer evaluations through the computer which apparently allows her to slowly decrease alcohol intake.  She has, however been on able to wean off alcohol.  She presented to the emergency room 11/03/2019 with complaints of nausea with retching as well as BRBPR.  Intubated in the emergency room for emergent EGD and airway protection.  Endoscopy/colonoscopy results as documented above. Current Medications:I have utilized all available immediate resources to obtain, update, or review the patient's current medications. AllergiesAllergies Allergen Reactions ? Lactose (Bulk) Rash ? Lanolin Rash ? Nickel Rash Scheduled:Current Facility-Administered Medications Medication Dose Route Frequency Provider Last Rate Last Admin ? cefTRIAXone (ROCEPHIN) 1 g in water for injection, sterile 10 mL (100 mg/mL)  1 g IV Push Q24H Ferdie Ping, MD   1 g at 11/05/19 1700 ? chlorhexidine gluconate (PERIDEX) 0.12 % solution 15 mL  15 mL Mouth/Throat Q12H Ferdie Ping, MD   15 mL at 11/06/19 0823 ? [START ON 11/07/2019] cholecalciferol (vitamin D3) tablet 400 Units  400 Units Oral Daily Elshazly, Ahmed, MD     ? escitalopram oxalate (LEXAPRO) tablet 20 mg  20 mg Per NG tube Daily Alyse Low, APRN   20 mg at 11/06/19 4098 ? folic acid (FOLVITE) tablet 1 mg  1 mg Oral Daily Main, Marnee Spring, MD   1 mg at 11/06/19 1191 ? ipratropium-albuteroL (DUO-NEB) 0.5 mg-3 mg(2.5 mg base)/3 mL nebulizer solution 3 mL  3 mL Nebulization Q12H Ferdie Ping, MD   3 mL at 11/06/19 0807 ? metoprolol tartrate (LOPRESSOR) Immediate Release tablet 50 mg  50 mg Per NG tube BID Alyse Low, APRN   50 mg at 11/06/19 4782 ? morphine 2 mg/mL syringe          ? ondansetron (PF) (ZOFRAN) injection 4 mg  4 mg IV Push Once Nicholes Stairs, MD     ? pantoprazole (PROTONIX) 40 mg in sodium chloride 0.9% PF 10 mL (4 mg/mL)  40 mg IV Push Daily Phylliss Bob, MD   40 mg at 11/06/19 9562 ? PHENobarbital (LUMINAL) 130 mg/mL injection          ?  PHENobarbital (LUMINAL) 130 mg/mL injection          ? PHENobarbital (LUMINAL) injection 260 mg  260 mg Intravenous Q12H Alyse Low, APRN   260 mg at 11/06/19 1126 ? sodium chloride 0.9 % flush 3 mL  3 mL IV Push Q8H Main, Marnee Spring, MD   3 mL at 11/06/19 0538 ? thiamine (VITAMIN B1) tablet 100 mg  100 mg Oral Daily Alyse Low, APRN   100 mg at 11/06/19 8756 ? vecuronium (NORCURON) 10 mg injection          Infusions:? midazolam (VERSED) 100 mg in 100 mL (1 mg/mL) infusion 19 mg/hr (11/06/19 1011) PRN Meds:albuterol, ipratropium-albuteroL, midazolam (PF), midazolam, morphine (ADULT), ondansetron, simethicone, sodium chlorideVital Signs:Temp:  [98 ?F (36.7 ?C)-98.8 ?F (37.1 ?C)] 98.1 ?F (36.7 ?C)Pulse:  [69-97] 85Resp:  [13-25] 20BP: (118-164)/(53-91) 155/83SpO2:  [94 %-100 %] 98 %Device (Oxygen Therapy): mechanical ventilator I/O last 3 completed shifts:In: 3217 [P.O.:60; I.V.:2737; NG/GT:420]Out: 1725 [Urine:1725]Gross Totals (Last 24 hours) at 11/06/2019 1443Last data filed at 11/06/2019 1200Intake 2766.52 ml Output 1525 ml Net 1241.52 ml Last 6 Weights  11/03/19 0335 11/03/19 1000 11/03/19 1432 Weight (lbs): 231.26 Lbs. 230.16 Lbs. 230.16 Lbs. Vent Settings/oxygen settings:Vent mode: PRVC A/C (11/06/19 1427) Rate (bpm): 16 bpm (11/06/19 1427)Vt (mL): 450 mL (11/06/19 1427)PEEP/CPAP (cmH2O): 5 cmH20 (11/06/19 1427)Oxygen Concentration (%): 35 (11/06/19 1431)Peak Inspiratory Pressure (cm H2O) : 27 (11/06/19 1427)Plateau Pressure (cm H2O): 17 (11/06/19 1431)Static Compliance (mL/cm H2O): 39 (11/06/19 1431)  Physical Examination: General Opens eyes to verbal.  Following some commands.  Intermittent tremors, agitation   HEENT Sclera mildly icteric.Neck:  Full neck.  Trachea midline.ETT: Yes [x]  No []  Pupil size  2 mm Respiratory  Diminished bilateral bases, clear.  No crackles, wheeze or rhonchi. Extremity  Trace edema. Pulses 2+ bilaterally. CV Regular without murmurs, gallops, or heave Neuro sedated, intubatedFollows commands? Yes [x]  No []  GI/GU Soft, non-tender, normoactive bowel soundsNow with diarrheal stools.  Rectal tube placed.G tube: Yes [x]  No [] Foley: Yes [x]  No []  Skin No rashes     Lines/Tubes: Patient Lines/Drains/Airways Status  Active PICC Line / CVC Line / PIV Line / Drain / ART Line / NG/OG Tube / Catheter   Name:   Placement date:   Placement time:   Site:   Days:  Periph IV-single(adult) 11/03/19 0401 median cubital(antecubital fossa), right 20 gauge Nurse   11/03/19    0401    median cubital(antecubital fossa), right   1  Periph IV-single(adult) 11/03/19 1030 dorsum right arm over-the-needle catheter system 20 gauge Nurse   11/03/19    1030    dorsum right arm   1  Urethral Catheter 11/03/19 1455 16 10   11/03/19    1455     1  Naso/Oral Tube 11/03/19 1530 [place] sump 16 left nostril   11/03/19    1530     1    Laboratory Data: Labs Heme       - CBCRecent Labs Lab 03/18/210554 03/18/212012 03/19/210600 03/20/210647 WBC 6.38  --  5.66 6.48 HGB 8.6* 9.0* 9.1* 9.0* HCT 26.6* 28.2* 28.0* 28.2* PLT 160  --  155 139*  Heme         - CoagsRecent Labs Lab 03/17/210406 INR 1.0 Renal Recent Labs Lab 03/17/210851 03/18/210555 03/18/210606 03/19/210600 03/20/210603 NA  --  142  --  144 144 K  --  4.0  --  3.3* 3.4* CL  --  109*  --  115* 116* CO2  --  24  --  23 20* BUN  --  23*  --  11 10 CREATININE  --  1.34*  --  1.04* 0.91 CALCIUM  --  7.9*  --  7.7* 7.8* MG 1.7 1.7  --   --  1.9 PHOS 3.8  --  3.3  --  1.8*  GI/GU       - LFTsRecent Labs Lab 03/17/210406 03/18/210555 ALT 75 45 AST 82* 34 ALKPHOS 96 68 BILITOT 0.4 0.5 BILIDIR <0.10  --      - LIPASENo results for input(s): LIPASE in the last 168 hours.      - Recent Labs Lab 03/17/210851 AMMONIA 31  CV    - Cardiac EnzymesNo results for input(s): TROPONINI, CKTOTAL, CKMB in the last 168 hours.Invalid input(s): TROPONINPOC     pBNPNo components found for: PROBNP Endocrine   - No results for input(s): TSH in the last 168 hours.     - No results found for: FREET4  Endocrine    - Recent Labs Lab 03/17/210406 03/18/210555 03/19/210600 03/20/210603 GLU 121* 138* 104 100  Endocrine -  No results found for: HGBA1C   ID  Recent Labs Lab 03/17/210848 FERRITIN 271  Pending Labs: Pending Lab Results   Order Current Status  Hepatitis A Ab, total w/refl IgM In process  Hepatitis B core antibody, total In process  Hepatitis B surface antigen w/refl confirm-check     (LMW Q) In process  Pathology (BH GH LMW YH) In process  Pathology (BH GH LMW YH) In process  Blood culture Preliminary result  Blood culture Preliminary result  Lower respiratory culture Preliminary result  Lower respiratory culture, qualitative Preliminary result     ABG Data:Recent Labs Lab 03/17/211845 03/18/210537 03/20/210847 PHART 7.41 7.41 7.36 PCO2ART 41 38 41 PO2ART 189* 81 65* HCO3ART 25.5 24.0 23.2 Respiratory Culture:Lab Results Component Value Date  SARSCOV2 Negative 11/03/2019 Microbiology: Blood cultures 3/17: NGTDUrine cultures:Results for orders placed or performed during the hospital encounter of 11/03/19 Urine culture  Collection Time: 11/03/19  1:48 PM  Specimen: Urine, Clean Catch Result Value  Urine Culture, Routine >=100,000 CFU/mL Klebsiella pneumoniae (A)     Susceptibility  Klebsiella pneumoniae -  (no method available)*   *  *    Amoxicillin + Clavulanate <=8 Susceptible ug/mL   Aztreonam <=4 Susceptible ug/mL   Cefazolin* <=2 Susceptible ug/mL    * For uncomplicated UTI caused by E.coli, K.pneumoniae, or P.mirabilis, results of cefazolin testing can be used to predict results to the following oral agents: cefdinir, cefuroxime axetil, and cephalexin.   Ceftriaxone <=1 Susceptible ug/mL   Cephalothin <=8 Susceptible ug/mL   Ciprofloxacin <=0.5 Susceptible ug/mL   Gentamicin <=1 Susceptible ug/mL   Imipenem <=1 Susceptible ug/mL   Levofloxacin <=1 Susceptible ug/mL   Nitrofurantoin <=32 Susceptible ug/mL   Piperacillin <=16 Susceptible ug/mL   Piperacillin + Tazobactam <=8 Susceptible ug/mL   Tetracycline <=2 Susceptible ug/mL   Tigecycline <=1 Susceptible ug/mL   Trimethoprim + Sulfamethoxazole <=0.5 Susceptible ug/mL   * The reported antimicrobial agents are considered first line for the isolated pathogen; However, if relative or absolute contraindications exist to the reported antimicrobial agents please call the microbiology lab at [phone] for a report of other antimicrobials that were tested. Imaging Review:Chest x-ray 3/17:Endotracheal tube in satisfactory position. Nasogastric tube in the stomach. Cardiomegaly without congestive heart failure discrete confluent parenchymal consolidation.Chest x-ray 3/19:Small left basilar opacity with a questionable pleural effusion. Pneumonia could have this appearance. Clinical correlation and plain film follow up are  recommended.?[place] scans:N/A Other Imaging:Abdominal ultrasound 3/19:  Gallstones. No gallbladder wall thickening.No biliary dilatation.Increased hepatic parenchymal echogenicity, suggesting steatosis.?EKGs:No results found for this or any previous visit. Serial CXR reviewed while in ICU/CCU CLINICAL IMPRESSION  Melinda George is a 69 y.o.female with PMH alcohol dependence, GERD, HTN, HLD, depressive disorder and morbid obesity.  Admitted with lactic acidosis, UTI, GIB.   ASSESSMENT AND PLAN 1.	Cardiovascular: ?	Hemodynamically stable?	Back on beta-blocker to avoid reflex tachycardia (Lopressor 50 milligram b.i.d.,substituted for outpatient Bystolic dose)?	Tachycardia improved2. Pulmonary:?	Intubated 3/17?	ABG 3/20, 7.36/41/65/23.2?	Imaging as noted above, personally reviewed?	Afebrile?	Day 4 ceftriaxone?	Suctioning yellow, sputum culture pending?	Continue bronchodilators?	Tolerated SBT well on light sedation this morning?	May be able to extubate soon if no concerns for withdrawal symptoms3. Neurological:        ?	Wernicke's prophylaxis?	Versed drip while intubated?	Phenobarb added4. Gastrointestinal:  GI bleed?	EGD 3/17 showing gastric ulcers, diffuse gastritis and varices without bleed.  No evidence  Mallory-Weiss tear?	Off octreotide?	Colonoscopy results, probable diverticular bleed?	1 unit packed cells 3/18?	Hemoglobin unchanged, stable?	Elevated AFP tumor marker?	Abdominal ultrasound completed, results as noted above?	No significant finding?	 Hepatitis-B surface antibody, hepatitis C antibody negative?	IV Protonix b.i.d.5. Renal:  UTICr Cl in last 30 days: Serum creatinine: 0.91 mg/dL 16/10/96 [id number] creatinine clearance: 55 mL/min ?	Urine culture with growth of GNRs?	Ceftriaxone, day 3 ?	Creatinine improved, likely at baseline?	Net IO Since Admission: 6,005.18 mL [11/06/19 1443] 6. Infectious Disease: Blood cultures 3/17, NGTD             Urine culture with growth of Klebsiella pneumoniae, pansensitive             No leukocytosis              Afebrile              Short course ceftriaxone         7. FEN: ?	K:  3.4., goal > 4, repleted?	Mg:  1.9, goal > 2?	Phosphorous:  1.8, add Neutra-Phos Diet:  Tolerating tube feedings, Jevity 1.2?	I personally have reviewed hold all the patient's morning labs8. Heme/Onc: ?	Status post 1 unit packed cells?	Hemoglobin stable9. Endo: ?	Stable fasting glucoseProcedural recommendations at this time (if any):  NoneCRITICAL HOME MEDICATIONS ON HOLD Lexapro (resumed)Bystolic (Lopressor substituted)NaltrexoneI have reviewed the patient's problem list and updated it as needed.Prophylaxis: DVT Prophylaxis: SCDsPUD Prophylaxis: protonixICU ppx: Aspiration precautions, HOB > 30 degreesEarly mobilization, out of bed if ableDispo/Social: Patient to remain in the MICU.Surrogate decision maker: Primary Emergency Contact: Knoll,George, Home Phone: [phone] confirmed that the patient's advanced care plan is present, code status is documented, or surrogate decisionmaker is listed in the patient's medical record.  CODE status: Full Code/ACLS Primary Emergency Contact: Hollopeter,George, Home Phone: [phone] care statement: The patient has required 45  minutes of my undivided attention during the past 24 hours for one of the previously noted life threatening diagnoses. Alyse Low, APRNElectronically Signed by Alyse Low, APRN3/20/20212:56 PM

## 2019-11-06 NOTE — Progress Notes
[facility]	General Medicine Progress NotePatient Data:  Patient Name: Melinda George Age: 69 y.o. DOB: [birth year]	 MRN: [id number]	 Hospital Medicine Progress Cornelious Bryant, MDSubjective: Patient is seen and examined at bedside. Patient is not in acute distress.  Waking up, following commands, moving 4 extremities.MEDS:  I have reviewed Medications & Allergy ListCurrent Facility-Administered Medications Medication Dose Route Frequency Provider Last Rate Last Admin ? cefTRIAXone (ROCEPHIN) 1 g in water for injection, sterile 10 mL (100 mg/mL)  1 g IV Push Q24H Ferdie Ping, MD   1 g at 11/05/19 1700 ? chlorhexidine gluconate (PERIDEX) 0.12 % solution 15 mL  15 mL Mouth/Throat Q12H Ferdie Ping, MD   15 mL at 11/06/19 0823 ? escitalopram oxalate (LEXAPRO) tablet 20 mg  20 mg Per NG tube Daily Alyse Low, APRN   20 mg at 11/06/19 4098 ? folic acid (FOLVITE) tablet 1 mg  1 mg Oral Daily Main, Marnee Spring, MD   1 mg at 11/06/19 1191 ? ipratropium-albuteroL (DUO-NEB) 0.5 mg-3 mg(2.5 mg base)/3 mL nebulizer solution 3 mL  3 mL Nebulization Q12H Ferdie Ping, MD   3 mL at 11/06/19 0807 ? metoprolol tartrate (LOPRESSOR) Immediate Release tablet 50 mg  50 mg Per NG tube BID Alyse Low, APRN   50 mg at 11/06/19 4782 ? ondansetron (PF) (ZOFRAN) injection 4 mg  4 mg IV Push Once Nicholes Stairs, MD     ? pantoprazole (PROTONIX) 40 mg in sodium chloride 0.9% PF 10 mL (4 mg/mL)  40 mg IV Push Daily Phylliss Bob, MD   40 mg at 11/06/19 9562 ? sodium chloride 0.9 % flush 3 mL  3 mL IV Push Q8H Main, Marnee Spring, MD   3 mL at 11/06/19 0538 ? thiamine (VITAMIN B1) tablet 100 mg  100 mg Oral Daily Alyse Low, APRN   100 mg at 11/06/19 1308 ? vecuronium (NORCURON) 10 mg injection          .Past Medical History: Diagnosis Date ? Alcohol abuse, in remission 12/15/2011 ? Alcohol dependence (HC Code)  ? Body mass index 39.0-39.9, adult 06/02/2013 ? Depressive disorder 12/21/2009 ? Essential hypertension 12/21/2009  CONT CURRENT MEDS, CR AUG 2014 0.9 ? Essential tremor 08/22/2010 ? Gastroesophageal reflux disease 12/21/2009 ? Generalized anxiety disorder 12/21/2009 ? Hyperlipidemia 12/21/2009 ? Obesity 05/28/2013 ? Rosacea 12/21/2009 ? Sprain of ankle 04/30/2010 Physical Exam:Temp:  [97.6 ?F (36.4 ?C)-98.8 ?F (37.1 ?C)] 98.1 ?F (36.7 ?C)Pulse:  [69-97] 86Resp:  [13-25] 20BP: (104-162)/(53-96) 141/83SpO2:  [94 %-100 %] 95 %Intake/Output Summary (Last 24 hours) at 11/06/2019 1049Last data filed at 11/06/2019 0900Gross per 24 hour Intake 3068 ml Output 1700 ml Net 1368 ml Constitutional: intubated and sedated.Appears well-developed and well-nourished. HENT: Head: Normocephalic and atraumatic. Eyes: Conjunctivae and EOM are normal. Neck: No JVDCardiovascular: Normal rate and regular rhythm. Pulmonary/Chest: Effort normal and breath sounds normal. Abdominal: Soft.  normal bowel sounds. No tenderness. No rigidity.  No rebound tenderness.  mild to moderate distensionMusculoskeletal:  Exhibits no deformity. Neurological:  limited as patient is intubated and sedatedSkin: Skin is warm and dry. Psychiatric:  limited as patient is intubated and sedatedWaking up. Moving 4 extremities. purposeful movements. Data:Recent Labs Lab 03/17/210851 03/18/210555 03/18/210606 03/19/210600 03/20/210603 NA  --  142  --  144 144 K  --  4.0  --  3.3* 3.4* CL  --  109*  --  115* 116* CO2  --  24  --  23 20* BUN  --  23*  --  11 10  CREATININE  --  1.34*  --  1.04* 0.91 GLU  --  138*  --  104 100 CALCIUM  --  7.9*  --  7.7* 7.8* MG 1.7 1.7  --   --  1.9 PHOS 3.8  --  3.3  --  1.8*  Recent Labs Lab 03/17/210406 03/18/210555 ALBUMIN 3.0* 2.6* PROT 6.5 5.5* BILITOT 0.4 0.5 ALKPHOS 96 68 ALT 75 45 AST 82* 34 Recent Labs Lab 03/18/210554 03/18/212012 03/19/210600 03/20/210647 WBC 6.38  --  5.66 6.48 HGB 8.6* 9.0* 9.1* 9.0* HCT 26.6* 28.2* 28.0* 28.2* PLT 160  --  155 139* Recent Labs Lab 03/17/210406 INR 1.0 Recent Labs Lab 03/17/211201 LABBLOO No Growth to Date - No Growth to Date Recent Labs Lab 03/17/211348 LABURIN >=100,000 CFU/mL Klebsiella pneumoniae* No results found for this or any previous visit (from the past 1008 hour(s)).I have reviewed patients Radiology images/reports.  Assessment/Plan:GI bleed. Hematemesis Required intubation to protect airwayEGD showed non bleeding peptic ulcerColonoscopy showed possible diverticular bleedProtonixon tube feedsFollow-up sputum cultureStopped fluid. AKI resolved. Anemia 2/2 abovetransfusing 1 U 3/18, with acceptable post transfusion CBC.protonix bidHypokalemia / hypophosphatemiarepleteBorderline hypocalcemiavitamin D deficiency Started vit D. UTIUA showing pansensitive KlebsiellaKeep on rocephin for now. Lactic acidosisresolved Alcohol disorderMonitor for withrawal. As patient drinks 1.5 L of white wine dailyThiamine and folic acidModerate/severe tremor2/2 essential tremorSees neurology in [employer] hematuriaWill need follow up ua in outpatient. DVT prophylaxis: SCDCare/plan discussed with patient and bedside nurse, intensivist.I have confirmed that the patient's advanced care plan is present or surrogate decision maker is listed in the patient's medical record.I have utilized all available immediate resources to obtain, update, or review the patient's current medications.Full Code/ACLSLength of visit 45 minutes, with >50% spent counseling and coordinating care.Discharge Plan:  >48h.Signed:Chidi Shirer Burgess Estelle, MD3/20/202111:25

## 2019-11-06 NOTE — Plan of Care
Plan of Care Overview/ Patient Status    Pt received on ordered settings, SBT performed, resp mechanics performed, abg obtained and reported.  Suctioned for clear to white secretions, yellow plug noted mid morning.  Resp tx given as ordered, oral care performed. Pt not awake enough to extubate. Will continue to monitor.

## 2019-11-06 NOTE — Plan of Care
Plan of Care Overview/ Patient Status    Patient remains on ordered ventilator settings.  Thin, white secretions suctioned via inline catheter.  Will continue to monitor.

## 2019-11-06 NOTE — Plan of Care
Problem: Adult Inpatient Plan of CareGoal: Plan of Care ReviewOutcome: Interventions implemented as appropriateGoal: Patient-Specific Goal (Individualized)Outcome: Interventions implemented as appropriateGoal: Absence of Hospital-Acquired Illness or InjuryOutcome: Interventions implemented as appropriateGoal: Optimal Comfort and WellbeingOutcome: Interventions implemented as appropriateGoal: Readiness for Transition of CareOutcome: Interventions implemented as appropriate Plan of Care Overview/ Patient Status    Pt remains intubated, O2 stable on 35% FiO2 and 5 PEEP. Tolerated breathing trial today. Alert, following commands. Did have episode restlessness, agitation leading to conclusion of breathing trial. Phenobarbital initiated. Tolerating tube feedings (Osmolite) at goal. IVF's dc'd. Urine output adequate. K+ replaced. Receiving rocephin. BM today. VSS. Will continue to monitor.

## 2019-11-07 ENCOUNTER — Ambulatory Visit: Admit: 2019-11-07 | Payer: PRIVATE HEALTH INSURANCE

## 2019-11-07 LAB — CBC WITH AUTO DIFFERENTIAL
BKR WAM ABSOLUTE IMMATURE GRANULOCYTES.: 0.05 x 1000/ÂµL (ref 0.00–0.10)
BKR WAM ABSOLUTE LYMPHOCYTE COUNT.: 1.01 x 1000/ÂµL (ref 1.00–4.50)
BKR WAM ABSOLUTE NEUTROPHIL COUNT.: 3.29 x 1000/ÂµL (ref 1.40–6.60)
BKR WAM BASOPHIL ABSOLUTE COUNT.: 0.05 x 1000/ÂµL (ref 0.0–0.3)
BKR WAM BASOPHILS: 0.9 % (ref 0.0–3.0)
BKR WAM EOSINOPHIL ABSOLUTE COUNT.: 0.24 x 1000/ÂµL (ref 0.00–0.45)
BKR WAM EOSINOPHILS: 4.2 % — ABNORMAL HIGH (ref 0.0–4.0)
BKR WAM HEMATOCRIT: 30.6 % — ABNORMAL LOW (ref 34.0–45.0)
BKR WAM HEMOGLOBIN: 9.6 g/dL — ABNORMAL LOW (ref 11.0–15.0)
BKR WAM IMMATURE GRANULOCYTES: 0.9 % (ref 0.0–1.0)
BKR WAM LYMPHOCYTES: 17.6 % — ABNORMAL LOW (ref 25.0–45.0)
BKR WAM MCH PG: 31.2 pg (ref 27–33)
BKR WAM MCHC: 31.4 g/dL — ABNORMAL LOW (ref 32.0–36.0)
BKR WAM MCV: 99.4 fL — ABNORMAL HIGH (ref 79.0–99.0)
BKR WAM MONOCYTE ABSOLUTE COUNT.: 1.09 x 1000/ÂµL (ref 0.00–1.20)
BKR WAM MONOCYTES: 19 % — ABNORMAL HIGH (ref 0.0–12.0)
BKR WAM MPV: 8.6 fL (ref 7.5–11.5)
BKR WAM NEUTROPHILS: 57.4 % (ref 36.0–66.0)
BKR WAM NUCLEATED RED BLOOD CELLS: 0 % (ref 0.0–5.0)
BKR WAM PLATELETS: 159 x1000/ÂµL (ref 140–400)
BKR WAM RDW-CV: 13.7 % (ref 11.5–14.5)
BKR WAM RED BLOOD CELL COUNT.: 3.08 M/ÂµL — ABNORMAL LOW (ref 4.00–5.20)
BKR WAM WHITE BLOOD CELL COUNT.: 5.73 x1000/ÂµL (ref 4.00–10.00)

## 2019-11-07 LAB — BASIC METABOLIC PANEL
BKR ANION GAP (LM): 9 mmol/L (ref 5–15)
BKR BLOOD UREA NITROGEN: 11 mg/dL (ref 7–18)
BKR CALCIUM: 8.1 mg/dL — ABNORMAL LOW (ref 8.5–10.1)
BKR CHLORIDE: 113 mmol/L — ABNORMAL HIGH (ref 98–107)
BKR CO2: 22 mmol/L (ref 21–32)
BKR CREATININE: 0.79 mg/dL (ref 0.55–1.02)
BKR EGFR (AFR AMER) (LMC): >60 mL/min/{1.73_m2} (ref >60)
BKR EGFR (NON AFR AMER) (LMC): >60 mL/min/{1.73_m2} (ref >60)
BKR GLUCOSE: 104 mg/dL (ref 65–110)
BKR POTASSIUM: 3.3 mmol/L — ABNORMAL LOW (ref 3.5–5.1)
BKR SODIUM: 144 mmol/L (ref 136–145)

## 2019-11-07 LAB — LOWER RESP CULTURE, QUAL
BKR GRAM STAIN (ROUTINE): <10
BKR LOWER RESPIRATORY CULTURE (QUAL): NORMAL

## 2019-11-07 LAB — BLOOD GAS, ARTERIAL     (LMW)
BKR AA GRADIENT LM: 131 mmHg
BKR FIO2: 35 %
BKR HCO3, ARTERIAL: 24 mmol/L (ref 22.0–28.0)
BKR PCO2 ARTERIAL: 38 mmHg (ref 35–45)
BKR PH, ARTERIAL: 7.41 U (ref 7.35–7.45)
BKR PO2, ARTERIAL: 71 mmHg — ABNORMAL LOW (ref 80–100)

## 2019-11-07 LAB — MAGNESIUM: BKR MAGNESIUM: 1.8 mg/dL (ref 1.6–2.6)

## 2019-11-07 LAB — PHOSPHORUS     (BH GH L LMW YH): BKR PHOSPHORUS: 3.6 mg/dL (ref 2.5–4.9)

## 2019-11-07 MED ORDER — POTASSIUM BICARBONATE-CITRIC ACID 25 MEQ EFFERVESCENT TABLET
25 mEq | Freq: Once | ORAL | Status: CP
Start: 2019-11-07 — End: ?
  Administered 2019-11-07: 13:00:00 25 mEq via ORAL

## 2019-11-07 MED ORDER — THIAMINE HCL (VITAMIN B1) 100 MG/ML INJECTION SOLUTION
100 mg/mL | Freq: Every day | INTRAVENOUS | Status: DC
Start: 2019-11-07 — End: 2019-11-09
  Administered 2019-11-07 – 2019-11-08 (×2): 100 mL via INTRAVENOUS

## 2019-11-07 MED ORDER — LORAZEPAM 2 MG/ML INJECTION SOLUTION
2 mg/mL | INTRAVENOUS | Status: DC | PRN
Start: 2019-11-07 — End: 2019-11-09

## 2019-11-07 NOTE — Plan of Care
Problem: Adult Inpatient Plan of CareGoal: Plan of Care ReviewOutcome: Interventions implemented as appropriate Plan of Care Overview/ Patient Status    Pt extubated this am. O2 stable on 2L NC. Worked with PT, OOB to chair with hoyer lift. VSS. Son & husband in to visit. Will continue to monitor.

## 2019-11-07 NOTE — Progress Notes
[facility]	General Medicine Progress NotePatient Data:  Patient Name: Melinda George Age: 69 y.o. DOB: Jun 11, 1951	 MRN: [id number]	 Hospital Medicine Progress Cornelious Bryant, MDSubjective: Patient is seen and examined at bedside. Patient is not in acute distress.  Waking up, following commands. Has cuff leak. Good NIF. Discussed with [place] and agreed with extubation today. MEDS:  I have reviewed Medications & Allergy ListCurrent Facility-Administered Medications Medication Dose Route Frequency Provider Last Rate Last Admin ? cefTRIAXone (ROCEPHIN) 1 g in water for injection, sterile 10 mL (100 mg/mL)  1 g IV Push Q24H Ferdie Ping, MD   1 g at 11/06/19 1703 ? chlorhexidine gluconate (PERIDEX) 0.12 % solution 15 mL  15 mL Mouth/Throat Q12H Ferdie Ping, MD   15 mL at 11/07/19 0912 ? cholecalciferol (vitamin D3) tablet 400 Units  400 Units Oral Daily Ferdie Ping, MD   400 Units at 11/07/19 0912 ? escitalopram oxalate (LEXAPRO) tablet 20 mg  20 mg Per NG tube Daily Alyse Low, APRN   20 mg at 11/07/19 4098 ? folic acid (FOLVITE) tablet 1 mg  1 mg Oral Daily Main, Marnee Spring, MD   1 mg at 11/07/19 1191 ? ipratropium-albuteroL (DUO-NEB) 0.5 mg-3 mg(2.5 mg base)/3 mL nebulizer solution 3 mL  3 mL Nebulization Q12H Ferdie Ping, MD   3 mL at 11/06/19 2020 ? metoprolol tartrate (LOPRESSOR) Immediate Release tablet 50 mg  50 mg Per NG tube BID Alyse Low, APRN   50 mg at 11/07/19 4782 ? morphine 2 mg/mL syringe          ? ondansetron (PF) (ZOFRAN) injection 4 mg  4 mg IV Push Once Nicholes Stairs, MD     ? pantoprazole (PROTONIX) 40 mg in sodium chloride 0.9% PF 10 mL (4 mg/mL)  40 mg IV Push Daily Phylliss Bob, MD   40 mg at 11/07/19 0912 ? PHENobarbital (LUMINAL) 130 mg/mL injection          ? PHENobarbital (LUMINAL) 130 mg/mL injection          ? PHENobarbital (LUMINAL) injection 260 mg  260 mg Intravenous Q12H Alyse Low, APRN   260 mg at 11/06/19 2019 ? potassium and sodium phosphates (ELEMENTAL phosphorus 250 mg/packet) (Phos-NaK) oral powder 250 mg  250 mg Per NG tube 4x daily Alyse Low, APRN   250 mg at 11/07/19 9562 ? sodium chloride 0.9 % flush 3 mL  3 mL IV Push Q8H Main, Marnee Spring, MD   3 mL at 11/07/19 0550 ? thiamine (VITAMIN B1) tablet 100 mg  100 mg Oral Daily Alyse Low, APRN   100 mg at 11/07/19 1308 ? vecuronium (NORCURON) 10 mg injection          .Past Medical History: Diagnosis Date ? Alcohol abuse, in remission 12/15/2011 ? Alcohol dependence (HC Code)  ? Body mass index 39.0-39.9, adult 06/02/2013 ? Depressive disorder 12/21/2009 ? Essential hypertension 12/21/2009  CONT CURRENT MEDS, CR AUG 2014 0.9 ? Essential tremor 08/22/2010 ? Gastroesophageal reflux disease 12/21/2009 ? Generalized anxiety disorder 12/21/2009 ? Hyperlipidemia 12/21/2009 ? Obesity 05/28/2013 ? Rosacea 12/21/2009 ? Sprain of ankle 04/30/2010 Physical Exam:Temp:  [98 ?F (36.7 ?C)-98.4 ?F (36.9 ?C)] 98.4 ?F (36.9 ?C)Pulse:  [62-88] 62Resp:  [16-23] 16BP: (115-164)/(45-90) 116/54SpO2:  [92 %-98 %] 94 %Intake/Output Summary (Last 24 hours) at 11/07/2019 0924Last data filed at 11/07/2019 0700Gross per 24 hour Intake 1430.52 ml Output 1510 ml Net -79.48 ml Constitutional: intubated and sedated.Appears well-developed and well-nourished. HENT: Head: Normocephalic and atraumatic. Eyes:  Conjunctivae and EOM are normal. Neck: No JVDCardiovascular: Normal rate and regular rhythm. Pulmonary/Chest: Effort normal and breath sounds normal. Abdominal: Soft.  normal bowel sounds. No tenderness. No rigidity.  No rebound tenderness.  mild to moderate distensionMusculoskeletal:  Exhibits no deformity. Neurological:  limited as patient is intubated and sedatedSkin: Skin is warm and dry. Psychiatric:  limited as patient is intubated and sedatedWaking up. Moving 4 extremities. purposeful movements. Data:Recent Labs Lab 03/18/210555 03/18/210606 03/19/210600 03/20/210603 03/21/210449 NA 142  --  144 144 144 K 4.0  --  3.3* 3.4* 3.3* CL 109*  --  115* 116* 113* CO2 24  --  23 20* 22 BUN 23*  --  11 10 11  CREATININE 1.34*  --  1.04* 0.91 0.79 GLU 138*  --  104 100 104 CALCIUM 7.9*  --  7.7* 7.8* 8.1* MG 1.7  --   --  1.9 1.8 PHOS  --  3.3  --  1.8* 3.6  Recent Labs Lab 03/17/210406 03/18/210555 ALBUMIN 3.0* 2.6* PROT 6.5 5.5* BILITOT 0.4 0.5 ALKPHOS 96 68 ALT 75 45 AST 82* 34 Recent Labs Lab 03/19/210600 03/20/210647 03/21/210450 WBC 5.66 6.48 5.73 HGB 9.1* 9.0* 9.6* HCT 28.0* 28.2* 30.6* PLT 155 139* 159 Recent Labs Lab 03/17/210406 INR 1.0 Recent Labs Lab 03/17/211201 LABBLOO No Growth to Date - No Growth to Date Recent Labs Lab 03/17/211348 LABURIN >=100,000 CFU/mL Klebsiella pneumoniae* No results found for this or any previous visit (from the past 1008 hour(s)).I have reviewed patients Radiology images/reports.  Assessment/Plan:GI bleed. Hematemesis Required intubation to protect airwayEGD showed non bleeding peptic ulcerColonoscopy showed possible diverticular bleedProtonixon tube feedsFollow-up sputum cultureStopped fluid. AKI resolved. hgb stable.Anemia 2/2 abovetransfusing 1 U 3/18, with acceptable post transfusion CBC.protonix bidhgb stable.hypokalemiarepleteBorderline hypocalcemiavitamin D deficiency Started vit D. UTIUA showing pansensitive KlebsiellaKeep on rocephin for now. Lactic acidosisresolved Alcohol disorderMonitor for withrawal. As patient drinks 1.5 L of white wine dailyThiamine and folic acidModerate/severe tremor2/2 essential tremorSees neurology in [employer] hematuriaWill need follow up ua in outpatient. DVT prophylaxis: SCDCare/plan discussed with patient and bedside nurse, intensivist.I have confirmed that the patient's advanced care plan is present or surrogate decision maker is listed in the patient's medical record.I have utilized all available immediate resources to obtain, update, or review the patient's current medications.Full Code/ACLSLength of visit 45 minutes, with >50% spent counseling and coordinating care.Discharge Plan:  >48h.Signed:Amal Saiki Benham, MD3/21/202111:25Patient had good cuff leak, communicated with intensivist who agreed with extubation, patient was successfully extubated, tolerated post extubation, was sitting in bed and later sitting in a chair, protecting her airway, following commands.

## 2019-11-07 NOTE — Plan of Care
Plan of Care Overview/ Patient Status    Pt received on ordered settings. Pt was restless at the beginning of the shift, but settled down by the morning. No vent settings were changed on this shift. RT will continue to monitor.

## 2019-11-07 NOTE — Plan of Care
Plan of Care Overview/ Patient Status    Pt found on ordered settings. RN turned off sedation, pt placed on SBT, ABG reported to Dr Burgess Estelle. Resp mechanics done and reported, cuff leak present and pt following all commands. Pt extubated per order to 3lpm nc, titrating as tolerated. Pt has strong prod cough of pale yellow.  Will continue to monitor.

## 2019-11-07 NOTE — Plan of Care
Inpatient Physical Therapy EvaluationIP Adult PT Eval/Treat - 11/07/19 1320    Date of Visit / Treatment  Date of Visit / Treatment  11/07/19   Note Type  Evaluation   Start Time  1300   End Time  1320   Total Treatment Time     General Information  Pertinent History Of Current Problem  69 yo female to ED/ICU on 11/03/19 due to vomitting and melena, ETOH dependence. and GI bleed.  PMH of hypertension, dyslipidemia, essential tremor, class 2 obesity, generalized anxiety disorder, depression, and alcohol dependence and abuse who has been actively drinking since 2012.  Intubated x 3 days.  Extubated today.   Subjective  Pt mumbling, observed to be attempting to move in bed.     General Observations  Pt supine in bed.    Precautions/Limitations  fall precautions    Prior Level of Functioning/Social History  Patient resides with:  Spouse    Vital Signs and Orthostatic Vital Signs  Vital Signs  Vital Signs Stable    Patient Coping  Observed Emotional State  agitated;anxious    Cognition  Overall Cognitive Status  Impaired    Range of Motion  Range of Motion Examination  no ROM deficits were identified    Manual Muscle Testing  Manual Muscle Testing Comments  LE strength grossly 3/5.     Sensory Assessment  Sensory Tests Results  No sensory impairment noted    Skin Assessment  Skin Assessment  See Nursing Documentation    Posture, Head/Trunk Alignment  Posture, Head/Trunk Alignment  Forward head    Balance  Sitting Balance: Static   POOR-     Maximal assist to maintain static position with no Assistive Device    Bed Mobility  Rolling/Turning Right - Independence/Assistance Level  Maximum assist   Rolling/Turning Left - Independence/Assistance Level  Maximum assist   Supine-to-Sit Independence/Assistance Level  Maximum assist   Bed mobility was limited by:  cognition    Sit-Stand Transfer Training  Sit-to-Stand Transfer Assist Device  Hoyer lift    Handoff Documentation  Handoff  Patient in chair;Discussed with nursing    AM-PAC - Basic Mobility Screen- How much help from another person do you currently need.....  Turning from your back to your side while in a a flat bed without using rails?  1 - Total - Requires total assistance or cannot do it at all.   Moving from lying on your back to sitting on the side of a flat bed without using bed rails?  1 - Total - Requires total assistance or cannot do it at all.   Moving to and from a bed to a chair (including a wheelchair)?  1 - Total - Requires total assistance or cannot do it at all.   Standing up from a chair using your arms(e.g., wheelchair or bedside chair)?  1 - Total - Requires total assistance or cannot do it at all.   To walk in a hospital room?  1 - Total - Requires total assistance or cannot do it at all.   Climbing 3-5 steps with a railing?  1 - Total - Requires total assistance or cannot do it at all.   AMPAC Mobility Score  6   Highest Level of Mobility TARGET  Mobility Level 2,Turn self in bed/bed activity/dependent transfer     Clinical Impression  Initial Assessment  69 yo female extubated today, admitted to ICU on 11/03/19 due to GI bleed and ETOH abuse.  Pt's cognition  very impaired at time of eval therefore with assistance of RN, Michiel Sites lift was used to transfer pt from bed to chair, with pt requiring max A to roll.  Pt did actively extend both knees upon being asked to while in chair.  Further mobility assessment is warranted as cognition improves.     Rehab Potential  fair, will monitor progress closely    Patient/Family Stated Goals  Patient/Family Stated Goal(s)  unable to state    Frequency/Equipment Recommendations  PT Frequency  Daily    PT Recommendations for Inpatient Admission  Activity/Level of Assist  transfers only;mechanical lift    Planned Treatment / Interventions  Plan for Next Visit Mobility assessment, EOB    PT Discharge Summary  PT Disposition Recommendation(s)  STR (Short Term Rehab at a SNF rehab unit)    Plan of Care Overview/ Patient Status    Problem: Physical Therapy GoalsGoal: Rehab Plan of Care Review, PTOutcome: Initial problem identificationGoal: Rehab Individuality and Mutuality, PTOutcome: Initial problem identificationGoal: Rehab Discharge Needs Assessment, PTOutcome: Initial problem identificationGoal: Physical Therapy GoalsDescription: 1. Pt to be (I) with HEP to improve functional mobility and balance upon discharge.2. Pt will be independent with all bed mobility to decrease risk of decubiti formation and to improve position of comfort.3. Pt will be min. A/CG with RW with all transfers so as to facilitate safety with ADL's.4.  Pt to ambulate 65' with RW with CG/min. A to facilitate safety with Wbing activities. Outcome: Initial problem identification

## 2019-11-07 NOTE — Plan of Care
Plan of Care Overview/ Patient Status    Patient remains on VC+, volumes recorded, no changes made. Duoneb treatment was given. Suctioned for scant clear Jisel Fleet. RT will continue to monitor.

## 2019-11-07 NOTE — Plan of Care
Plan of Care Overview/ Patient Status    Pt received on ordered settings. Mouth care was preformed. RT suctioned moderate amounts of thick cream colored secretions from ETT. Pt opening eyes. No vent settings were changed on this shift. RT will continue to monitor.

## 2019-11-07 NOTE — Plan of Care
Problem: Adult Inpatient Plan of CareGoal: Plan of Care ReviewOutcome: Interventions implemented as appropriateGoal: Patient-Specific Goal (Individualized)Outcome: Interventions implemented as appropriateGoal: Absence of Hospital-Acquired Illness or InjuryOutcome: Interventions implemented as appropriateGoal: Optimal Comfort and WellbeingOutcome: Interventions implemented as appropriateGoal: Readiness for Transition of CareOutcome: Interventions implemented as appropriate Problem: InfectionGoal: Infection Symptom ResolutionOutcome: Interventions implemented as appropriate Problem: Alcohol WithdrawalGoal: Alcohol Withdrawal Symptom ControlOutcome: Interventions implemented as appropriate Problem: Skin Injury Risk IncreasedGoal: Skin Health and IntegrityOutcome: Interventions implemented as appropriate Problem: Fall Injury RiskGoal: Absence of Fall and Fall-Related InjuryOutcome: Interventions implemented as appropriate Problem: Restraint, Nonbehavioral (Nonviolent)Goal: Discontinuation Criteria AchievedOutcome: Interventions implemented as appropriateGoal: Personal Dignity and Safety MaintainedOutcome: Interventions implemented as appropriate Plan of Care Overview/ Patient Status    Assumed care of pt at 1900, vss, afebrile. Remains intubated and lightly sedated, does open eyes spontaneously and follow commands. Occasionally restless, Versed bolus given x 1 with good effect. Foley draining adeq amt yellow urine, rectal tube draining mod to large amt dark brown liquid stool. Tol tube feedings. Bil wrist restraints in place for pt safety. Will cont to monitor.

## 2019-11-08 LAB — BASIC METABOLIC PANEL
BKR ANION GAP (LM): 8 mmol/L (ref 5–15)
BKR BLOOD UREA NITROGEN: 8 mg/dL (ref 7–18)
BKR CALCIUM: 8.4 mg/dL — ABNORMAL LOW (ref 8.5–10.1)
BKR CHLORIDE: 111 mmol/L — ABNORMAL HIGH (ref 98–107)
BKR CO2: 25 mmol/L (ref 21–32)
BKR CREATININE: 0.75 mg/dL (ref 0.55–1.02)
BKR EGFR (AFR AMER) (LMC): >60 mL/min/{1.73_m2} (ref >60)
BKR EGFR (NON AFR AMER) (LMC): >60 mL/min/{1.73_m2} (ref >60)
BKR GLUCOSE: 85 mg/dL (ref 65–110)
BKR POTASSIUM: 3.4 mmol/L — ABNORMAL LOW (ref 3.5–5.1)
BKR SODIUM: 144 mmol/L (ref 136–145)

## 2019-11-08 LAB — CBC WITH AUTO DIFFERENTIAL
BKR WAM ABSOLUTE IMMATURE GRANULOCYTES.: 0.04 x 1000/ÂµL (ref 0.00–0.10)
BKR WAM ABSOLUTE LYMPHOCYTE COUNT.: 0.89 x 1000/ÂµL — ABNORMAL LOW (ref 1.00–4.50)
BKR WAM ABSOLUTE NEUTROPHIL COUNT.: 4.52 x 1000/ÂµL (ref 1.40–6.60)
BKR WAM BASOPHIL ABSOLUTE COUNT.: 0.05 x 1000/ÂµL (ref 0.0–0.3)
BKR WAM BASOPHILS: 0.7 % (ref 0.0–3.0)
BKR WAM EOSINOPHIL ABSOLUTE COUNT.: 0.21 x 1000/ÂµL (ref 0.00–0.45)
BKR WAM EOSINOPHILS: 3 % (ref 0.0–4.0)
BKR WAM HEMATOCRIT: 27.8 % — ABNORMAL LOW (ref 34.0–45.0)
BKR WAM HEMOGLOBIN: 9 g/dL — ABNORMAL LOW (ref 11.0–15.0)
BKR WAM IMMATURE GRANULOCYTES: 0.6 % (ref 0.0–1.0)
BKR WAM LYMPHOCYTES: 12.6 % — ABNORMAL LOW (ref 25.0–45.0)
BKR WAM MCH PG: 31.3 pg (ref 27–33)
BKR WAM MCHC: 32.4 g/dL (ref 32.0–36.0)
BKR WAM MCV: 96.5 fL (ref 79.0–99.0)
BKR WAM MONOCYTE ABSOLUTE COUNT.: 1.35 x 1000/ÂµL — ABNORMAL HIGH (ref 0.00–1.20)
BKR WAM MONOCYTES: 19.1 % — ABNORMAL HIGH (ref 0.0–12.0)
BKR WAM MPV: 9.1 fL (ref 7.5–11.5)
BKR WAM NEUTROPHILS: 64 % (ref 36.0–66.0)
BKR WAM NUCLEATED RED BLOOD CELLS: 0 % (ref 0.0–5.0)
BKR WAM PLATELETS: 178 x1000/ÂµL (ref 140–400)
BKR WAM RDW-CV: 13.3 % (ref 11.5–14.5)
BKR WAM RED BLOOD CELL COUNT.: 2.88 M/ÂµL — ABNORMAL LOW (ref 4.00–5.20)
BKR WAM WHITE BLOOD CELL COUNT.: 7.06 x1000/ÂµL (ref 4.00–10.00)

## 2019-11-08 LAB — BLOOD CULTURE   (BH GH L LMW YH)
BKR BLOOD CULTURE: NO GROWTH
BKR BLOOD CULTURE: NO GROWTH

## 2019-11-08 MED ORDER — ESCITALOPRAM 10 MG TABLET
10 mg | Freq: Every day | ORAL | Status: DC
Start: 2019-11-08 — End: 2019-11-10
  Administered 2019-11-09 – 2019-11-10 (×2): 10 mg via ORAL

## 2019-11-08 MED ORDER — MORPHINE 2 MG/ML INJECTION SYRINGE
2 mg/mL | INTRAVENOUS | Status: DC | PRN
Start: 2019-11-08 — End: 2019-11-10
  Administered 2019-11-08: 05:00:00 2 mL via INTRAVENOUS

## 2019-11-08 MED ORDER — POTASSIUM CHLORIDE 10 MEQ/100ML IN STERILE WATER INTRAVENOUS PIGGYBACK
10 mEq/0 mL | INTRAVENOUS | Status: CP
Start: 2019-11-08 — End: ?
  Administered 2019-11-08 (×2): 10 mL/h via INTRAVENOUS

## 2019-11-08 MED ORDER — METOPROLOL TARTRATE IMMEDIATE RELEASE 50 MG TABLET
50 mg | Freq: Two times a day (BID) | ORAL | Status: DC
Start: 2019-11-08 — End: 2019-11-10
  Administered 2019-11-09 – 2019-11-10 (×4): 50 mg via ORAL

## 2019-11-08 NOTE — Plan of Care
Plan of Care Overview/ Patient Status    Calm and cooperative with care. Denies pain, Vitals stable. Two assist with RW to chair. Foley draining concentrated yellow urine. Speech evaluation ordered.

## 2019-11-08 NOTE — Plan of Care
Problem: Adult Inpatient Plan of CareGoal: Readiness for Transition of CareOutcome: Interventions implemented as appropriate Plan of Care Overview/ Patient Status    Patient sleeping, increased respirations noted.  Telephone call to husband to update on patients status. Husband agreeable to STR due to patient increase weakness. Referrals sent to STR.  Husband will be in later this afternoon to visit.  Will continue to follow CM Discharge Planning    Most Recent Value Discharge Planning Concerns to be Addressed  mental health, substance/tobacco abuse/use Barriers to Discharge  medical workup continued Patient/Patient Representative was presented with a list of choices of facilities, agencies and/or dme providers  They had no preference Referral(s) placed for:  STR (short term rehab at a SNF rehab unit) Patient goals/treatment preference for discharge are:   discharge to STR via ambulance Home Health Care Services Required  N/A Patient is considered homebound due to: (he/she requires considerable and taxing effort to leave their residence for medical reasons or religious services OR infrequently OR of short duration for other reasons)  n/a Equipment Needed After Discharge  none Mode of Transportation   Ambulance (add comment for special considerations) Designated Discharge Caregiver 1 Name designated caregiver  Lonia Chimera CM D/C Readiness PASRR completed and approved  N/A Authorization number obtained, if required  N/A Is there a 3 day INPATIENT Qualifying stay for Medicare Patients?  N/A DME Authorized/Delivered  N/A No needs identified/ follow up with PCP/MD  N/A Post acute care services secured W10 complete  Yes Pri Completed and Accepted   N/A Finalized Plan Post acute care services secured W10 complete  Yes  Maxie Better RN, BSNCase ManagementPhone: [phone]: [email or web address]

## 2019-11-08 NOTE — Plan of Care
Problem: Adult Inpatient Plan of CareGoal: Plan of Care ReviewOutcome: Interventions implemented as appropriateGoal: Patient-Specific Goal (Individualized)Outcome: Interventions implemented as appropriateGoal: Absence of Hospital-Acquired Illness or InjuryOutcome: Interventions implemented as appropriateGoal: Optimal Comfort and WellbeingOutcome: Interventions implemented as appropriateGoal: Readiness for Transition of CareOutcome: Interventions implemented as appropriate Plan of Care Overview/ Patient Status    O2 stable on 1L NC. Received K+ replacement. Worked with PT today, OOB to chair with x2 assist and rolling walker. Seen by speech, cleared for nectar thickened diet, tolerating well. Husband in to visit. VSS. Will continue to monitor.

## 2019-11-08 NOTE — Plan of Care
Plan of Care Overview/ Patient Status    Nutrition Follow Up: Pt unavailable for nutrition interview today, discussed care with team. Pt extubated 11/07/19, evaluated by speech today (11/08/19). Per SLP, recommended diet = Full liquids. Nectar thick. Straw Ok. Pills whole in apple sauce or similar medium. Patient may have (unthickened) water by teaspoon sips only. 1:1 feed. Aspiration Precautions. Please put prethickened water on tray.Noted no new weights; labs reviewed and noted; + BM 11/08/19. RD will initiate Magic Cup BID for supplemental nutrition while on current diet order. Current GOAL: Pt will consume >/= 75% of meals on current diet order. Pt's response to intervention: ImprovedNutrition Care Plan:Change intervention:- Initiate Magic Cup BID for supplemental nutrition. - Encourage PO intake on full liquid/nectar thick diet. - RD will monitor for diet/supplement tolerance, ability to advance diet as medically indicated, PO intake, weight trends, nutrition related lab values, and overall plan of care. Education Needs: None identified at this timeCultural Needs: None identified at this timeDischarge nutrition needs: None identified at this time

## 2019-11-08 NOTE — Plan of Care
Problem: Adult Inpatient Plan of CareGoal: Plan of Care ReviewOutcome: Interventions implemented as appropriateGoal: Patient-Specific Goal (Individualized)Outcome: Interventions implemented as appropriateGoal: Absence of Hospital-Acquired Illness or InjuryOutcome: Interventions implemented as appropriateGoal: Optimal Comfort and WellbeingOutcome: Interventions implemented as appropriateGoal: Readiness for Transition of CareOutcome: Interventions implemented as appropriate Problem: InfectionGoal: Infection Symptom ResolutionOutcome: Interventions implemented as appropriate Problem: Alcohol WithdrawalGoal: Alcohol Withdrawal Symptom ControlOutcome: Interventions implemented as appropriate Problem: Skin Injury Risk IncreasedGoal: Skin Health and IntegrityOutcome: Interventions implemented as appropriate Problem: Fall Injury RiskGoal: Absence of Fall and Fall-Related InjuryOutcome: Interventions implemented as appropriate Problem: Restraint, Nonbehavioral (Nonviolent)Goal: Discontinuation Criteria AchievedOutcome: Interventions implemented as appropriateGoal: Personal Dignity and Safety MaintainedOutcome: Interventions implemented as appropriate Problem: Physical Therapy GoalsGoal: Rehab Plan of Care Review, PTOutcome: Interventions implemented as appropriateGoal: Rehab Individuality and Mutuality, PTOutcome: Interventions implemented as appropriateGoal: Rehab Discharge Needs Assessment, PTOutcome: Interventions implemented as appropriateGoal: Physical Therapy GoalsDescription: 1. Pt to be (I) with HEP to improve functional mobility and balance upon discharge.2. Pt will be independent with all bed mobility to decrease risk of decubiti formation and to improve position of comfort.3. Pt will be min. A/CG with RW with all transfers so as to facilitate safety with ADL's.4.  Pt to ambulate 32' with RW with CG/min. A to facilitate safety with Wbing activities. Outcome: Interventions implemented as appropriate Plan of Care Overview/ Patient Status    Assumed care of pt at 1900, vss, afebrile. Very restless most of night, freq throwing legs over siderails and shifting self sideways in bed. Oriented to self but otherwise disoriented. Complained of pain in leg, Dr. Zettie Cooley notified and new ordered obtained for morphine. Morphine given at 0045 with fair effect. NPO pending speech and swallow eval, was able to drink some water without difficulty. Foley draining adeq amt yellow urine. Rectal tube draining sm amt dark brown liquid stool. Resting quietly at this time. Will cont to monitor.

## 2019-11-08 NOTE — Progress Notes
Patient Data:  Patient Name: Melinda George Age: 69 y.o. DOB: 12-16-1950	 MRN: [id number]	 L[facility]	 [facility]	Gastroenterology Progress NoteConsultation requested by: Attending Provider: Marquis Buggy, MD [phone] Date: 11/03/2019 Encounter Date: 03/22/21Subjective: Interim History: Melinda George is a 69 y.o. female patient is not extubated hemoglobin has remained stable she has not had further issues with alcohol withdrawal.Vitamin-D very low at 16.5Iron saturation low at 24% new lineRemains on multiple vitamin with minerals, thiamin folateB12 low normalPatient evaluated by speech and language diet recommendations in their notes with one-to-one feed in place concern for silent aspiration riskPatient has significant tremor separate from alcoholObjective: Medications: :Lactose (bulk), Lanolin, and NickelPrior to Admission medications  Medication Sig Start Date End Date Taking? Authorizing Provider escitalopram oxalate (LEXAPRO) 20 MG tablet TAKE 1 TABLET BY MOUTH EVERY DAY 09/29/17  Yes Kingsley Plan, MD gabapentin (NEURONTIN) 300 MG capsule Take 300 mg by mouth 2 (two) times daily with breakfast and dinner. TAKE 1 OR 2 CAPSULES AT BEDTIME 11/03/17  Yes [provider] naltrexone (REVIA) 50 mg tablet Take 50 mg by mouth 2 (two) times daily. 10/18/19  Yes [provider] BYSTOLIC 20 mg tablet TAKE 1 TABLET BY MOUTH EVERY DAY 11/04/19   Kingsley Plan, MD lactobacillus rhamnosus, GG, (CULTURELLE) 10 billion cell capsule Take 1 capsule by mouth daily.    [provider] Current Facility-Administered Medications: ?  albuterol neb sol 2.5 mg/3 mL (0.083%) (PROVENTIL,VENTOLIN), 2.5 mg, Nebulization, Q4H PRN, Burgess Estelle, Ahmed, MD?  cefTRIAXone (ROCEPHIN) 1 g in water for injection, sterile 10 mL (100 mg/mL), 1 g, IV Push, Q24H, Renda Rolls, MD, 1 g at 11/07/19 1703? cholecalciferol (vitamin D3) tablet 400 Units, 400 Units, Oral, Daily, Ferdie Ping, MD, 400 Units at 11/07/19 0912?  [START ON 11/09/2019] escitalopram oxalate (LEXAPRO) tablet 20 mg, 20 mg, Oral, Daily, Renda Rolls, MD?  folic acid (FOLVITE) tablet 1 mg, 1 mg, Oral, Daily, Main, Marnee Spring, MD, 1 mg at 11/07/19 8657?  ipratropium-albuteroL (DUO-NEB) 0.5 mg-3 mg(2.5 mg base)/3 mL nebulizer solution 3 mL, 3 mL, Nebulization, Q4H PRN, Burgess Estelle, Ahmed, MD, 3 mL at 11/04/19 1348?  LORazepam (ATIVAN) injection 2 mg, 2 mg, IV Push, Q2H PRN, Burgess Estelle, Ahmed, MD?  LORazepam (ATIVAN) injection 2 mg, 2 mg, IV Push, Q1H PRN, Burgess Estelle, Ahmed, MD?  metoprolol tartrate (LOPRESSOR) Immediate Release tablet 50 mg, 50 mg, Oral, BID, Renda Rolls, MD?  morphine syringe 2 mg, 2 mg, IV Push, Q4H PRN, Vernie Ammons, DO, 2 mg at 11/08/19 0042?  ondansetron (ZOFRAN-ODT) disintegrating tablet 4 mg, 4 mg, Oral, Q6H PRN, Main, Marnee Spring, MD?  pantoprazole (PROTONIX) 40 mg in sodium chloride 0.9% PF 10 mL (4 mg/mL), 40 mg, IV Push, Daily, Jennier Schissler, Megan Mans, MD, 40 mg at 11/08/19 8469?  sodium chloride 0.9 % flush 3 mL, 3 mL, IV Push, Q8H, Main, Marnee Spring, MD, 3 mL at 11/08/19 0540?  sodium chloride 0.9 % flush 3 mL, 3 mL, IV Push, PRN for Line Care, Main, Marnee Spring, MD?  thiamine (VITAMIN B1) injection 100 mg, 100 mg, IV Push, Daily, Burgess Estelle, Ahmed, MD, 100 mg at 11/08/19 0843?  thiamine (VITAMIN B1) tablet 100 mg, 100 mg, Oral, Daily, Alyse Low, APRN, 100 mg at 11/07/19 0912Vitals:Temp:  [97.5 ?F (36.4 ?C)-99.1 ?F (37.3 ?C)] 98.3 ?F (36.8 ?C)Pulse:  [74-99] 87Resp:  [16-30] 20BP: (121-159)/(50-87) 135/66SpO2:  [94 %-98 %] 96 %I/O's:Gross Totals (Last 24 hours) at 11/08/2019 1415Last data filed at 11/08/2019 1300Intake 560 ml Output 2450  ml Net -1890 ml Procedures: Physical Exam: GENERAL:  pleasant and cooperative in no acute distress.  Still suffering with a central tremorHEENT:  Anicteric   CHEST: Clear to auscultation and percussion HEART: RRR, S1S2; No murmur ABDOMEN: Positive bowel sounds in all four quadrants, Soft, nontender. No organomegaly EXTREMITIES:  No cyanosis, clubbing, some edemaNEUROLOGIC: no  asterixis alert and oriented x3 patient does have this tremor I did not see her out of bedLabs:Recent Labs Lab 03/17/210406 03/17/210406 03/18/210555 03/18/210555 03/20/210603 03/20/210647 03/21/210449 03/21/210450 03/22/210551 NA 138  --  142   < > 144  --  144  --  144 K 4.8  --  4.0   < > 3.4*  --  3.3*  --  3.4* CL 103  --  109*   < > 116*  --  113*  --  111* CO2 22  --  24   < > 20*  --  22  --  25 BUN 19*  --  23*   < > 10  --  11  --  8 CREATININE 1.18*  --  1.34*   < > 0.91  --  0.79  --  0.75 CALCIUM 8.8  --  7.9*   < > 7.8*  --  8.1*  --  8.4* PROT 6.5  --  5.5*  --   --   --   --   --   --  BILITOT 0.4  --  0.5  --   --   --   --   --   --  ALKPHOS 96  --  68  --   --   --   --   --   --  ALT 75  --  45  --   --   --   --   --   --  AST 82*  --  34  --   --   --   --   --   --  GLU 121*  --  138*   < > 100  --  104  --  85 INR 1.0  --   --   --   --   --   --   --   --  WBC 6.50   < >  --    < >  --  6.48  --  5.73 7.06 HGB 12.1   < >  --    < >  --  9.0*  --  9.6* 9.0* HCT 36.4   < >  --    < >  --  28.2*  --  30.6* 27.8* PLT 196   < >  --    < >  --  139*  --  159 178  < > = values in this interval not displayed. Diagnostics:No results found.Assessment: Assessment: Melinda George is a 69 y.o.  female admitted with GI bleeding on 11/03/2019.  This is hospital day 5. Patient is not extubated without findings of alcohol withdrawal.  Patient's hemoglobin hematocrit remain stable no further evidence of GI bleeding.  Upper tract was not impressive for GI blood loss lower tract did appear to have a diverticular component.Patient is to go does short-term rehab due to increased weakness.  Hopefully they will be able to continue to work with alcohol rehabPatient has only received 1 unit of packed cells.  AFP is normal.  Patient did have some gastritis and superficial ulcers on pantoprazole.Patient is on treatment for Klebsiella  pneumoniae urinary tract infectionPatient continues on multiple vitamin minerals thiamin folatePlan:  1. Continue pantoprazole2. Continue vitamin replacement3. Awaiting hepatitis a serology4. Fatty liver will need to be followed closely likely elastography as an outpatient weight loss low-fat diet exercise abstinence from alcohol recommended Signed:Jamesmichael Shadd Jodelle Green, MDCell Preferred Contact: [phone] Contact: [phone] Fax:  [phone] Address:  [address]                           [place], [place] [address] 11/08/2019

## 2019-11-08 NOTE — Plan of Care
Problem: Physical Therapy GoalsGoal: Rehab Plan of Care Review, PTOutcome: Interventions implemented as appropriateGoal: Rehab Individuality and Mutuality, PTOutcome: Interventions implemented as appropriateGoal: Rehab Discharge Needs Assessment, PTOutcome: Interventions implemented as appropriateGoal: Physical Therapy GoalsDescription: 1. Pt to be (I) with HEP to improve functional mobility and balance upon discharge.2. Pt will be independent with all bed mobility to decrease risk of decubiti formation and to improve position of comfort.3. Pt will be min. A/CG with RW with all transfers so as to facilitate safety with ADL's.4.  Pt to ambulate 13' with RW with CG/min. A to facilitate safety with Wbing activities. Outcome: Interventions implemented as appropriate Inpatient Physical Therapy Progress Note Default Flowsheet Data (last 24 hours)  IP Adult PT Eval/Treat   Row Name 11/08/19 1045 11/07/19 1320   Date of Visit / Treatment  Date of Visit / Treatment  11/08/19  11/07/19  Note Type  --  Evaluation  Start Time  --  1300  End Time  --  1320  Total Treatment Time  --    General Information  Pertinent History Of Current Problem  --  69 yo female to ED/ICU on 11/03/19 due to vomitting and melena, ETOH dependence. and GI bleed.  PMH of hypertension, dyslipidemia, essential tremor, class 2 obesity, generalized anxiety disorder, depression, and alcohol dependence and abuse who has been actively drinking since 2012.  Intubated x 3 days.  Extubated today.  Subjective  Pt agrees to participate  Pt mumbling, observed to be attempting to move in bed.    General Observations  Asleep, easily aroused, ICU bed, monitors, recatl tube, foley cath  Pt supine in bed.   Precautions/Limitations  fall precautions;bed alarm;chair alarm  fall precautions   Prior Level of Functioning/Social History  Patient resides with:  --  Spouse   Vital Signs and Orthostatic Vital Signs  Vital Signs  --  Vital Signs Stable   Patient Coping  Observed Emotional State  --  agitated;anxious   Cognition  Overall Cognitive Status  --  Impaired  Level of Consciousness  lethargic;other (see comments) more alert today, states feels loopy  --  Following Commands  Inconsistently able to follow one step commands;Follows one step commands with increased time;Follows one step commands with repetition  --   Range of Motion  Range of Motion Examination  --  no ROM deficits were identified   Manual Muscle Testing  Manual Muscle Testing Comments  --  LE strength grossly 3/5.    Sensory Assessment  Sensory Tests Results  --  No sensory impairment noted   Skin Assessment  Skin Assessment  --  See Nursing Documentation   Posture, Head/Trunk Alignment  Posture, Head/Trunk Alignment  --  Forward head   Balance  Sitting Balance: Static   FAIR       Maintains static position without assist or device, may require Supervision or Verbal Cues (<2 minutes)  POOR-     Maximal assist to maintain static position with no Assistive Device  Sitting Balance: Dynamic   FAIR-     Performs dynamic activities through partial range (50-75%) with Contact Guard  --  Standing Balance: Static  POOR+    Minimal assist to maintain static position with no Assistive Device  --  Standing Balance: Dynamic   POOR     Moves through 1/4 to 1/2 ROM range with moderate assist to right self  --  Balance Assist Device  Rolling walker  --   Bed Mobility  Symptoms Noted During/After Treatment  none  --  Rolling/Turning Right - Independence/Assistance Level  --  Maximum assist  Rolling/Turning Left - Independence/Assistance Level  --  Maximum assist  Supine-to-Sit Independence/Assistance Level  Minimum assist;Assist of 2  Maximum assist  Supine-to-Sit Assist Device  Bed rails;Head of bed elevated  --  Limitations  impaired ability to control trunk for mobility  --  Bed mobility was limited by:  impaired motor function  cognition   Sit-Stand Transfer Training  Symptoms Noted During/After Treatment Marketing executive)  none  --  Sit-to-Stand Transfer Independence/Assistance Level  Minimum assist;Assist of 2  --  Sit-to-Stand Transfer Assist Device  Rolling walker;Gait belt  Industrial/product designer Transfer Independence/Assistance Level  Assist of 2;Moderate assist  --  Stand-to-Sit Transfer Assist Device  Rolling walker;Gait belt  --  Transfer Safety Analysis Concerns  decreased balance during turns;losing balance backward;cues for hand placement;decreased step length;decreased weight-shifting ability  --  Transfer Safety Analysis Impairments  impaired balance;impaired motor control;decreased strength  --   Gait Training  Symptoms Noted During/After Treatment   fatigue  --  Independence/Assistance Level   Moderate assist;Assist of 2  --  Assistive Device   Rolling walker;Gait belt  --  Gait Distance  bed to chair  --  Gait Analysis Deviations  decreased cadence;decreased step length;decreased weight-shifting ability  --  Ambulation distance was limited by:  impaired motor function;fatigue  --   Handoff Documentation  Handoff  Patient in chair;Patient instructed to call nursing for mobility;Discussed with nursing;Chair alarm recliner chai, nursing present  Patient in chair;Discussed with nursing   Endurance  Endurance Comments  impaired  --   AM-PAC - Basic Mobility Screen- How much help from another person do you currently need.....  Turning from your back to your side while in a a flat bed without using rails?  2 - A Lot - Requires a lot of help (maximum to moderate assistance). Can use assistive devices.  1 - Total - Requires total assistance or cannot do it at all.  Moving from lying on your back to sitting on the side of a flat bed without using bed rails?  2 - A Lot - Requires a lot of help (maximum to moderate assistance). Can use assistive devices.  1 - Total - Requires total assistance or cannot do it at all.  Moving to and from a bed to a chair (including a wheelchair)?  2 - A Lot - Requires a lot of help (maximum to moderate assistance). Can use assistive devices.  1 - Total - Requires total assistance or cannot do it at all.  Standing up from a chair using your arms(e.g., wheelchair or bedside chair)?  2 - A Lot - Requires a lot of help (maximum to moderate assistance). Can use assistive devices.  1 - Total - Requires total assistance or cannot do it at all.  To walk in a hospital room?  1 - Total - Requires total assistance or cannot do it at all.  1 - Total - Requires total assistance or cannot do it at all.  Climbing 3-5 steps with a railing?  --  1 - Total - Requires total assistance or cannot do it at all.  AMPAC Mobility Score  --  6  Highest Level of Mobility TARGET  --  Mobility Level 2,Turn self in bed/bed activity/dependent transfer    Therapeutic Exercise  Therapeutic Exercise Comments  supine bed ex B LE for incresed strength and ROM, diffiuclty following directions for heel slides,  performed SLRs  --   Neuromuscular Re-education  Neuromuscular Re-education Comments  EOB sitting balance, uncsupported w clS.  --   Clinical Impression  Initial Assessment  --  69 yo female extubated today, admitted to ICU on 11/03/19 due to GI bleed and ETOH abuse.  Pt's cognition very impaired at time of eval therefore with assistance of RN, Michiel Sites lift was used to transfer pt from bed to chair, with pt requiring max A to roll.  Pt did actively extend both knees upon being asked to while in chair.  Further mobility assessment is warranted as cognition improves.    Follow up Assessment  Pt more alert today, able to participate in bed to chair transfer w RW and Ax2. Pt w impaired balance, strength and motor function, recommend STR. Nurising assisted w transfer w RW. Recommended sit to stand or hoyer for back to bed if pt becomes fatigued sitting in chair.  --  Rehab Potential  --  fair, will monitor progress closely   Patient/Family Stated Goals  Patient/Family Stated Goal(s)  --  unable to state   Frequency/Equipment Recommendations  PT Frequency  --  Daily   PT Recommendations for Inpatient Admission  Activity/Level of Assist  assist of 2;moderate assistance;with rolling walker;mechanical lift;gait belt mechanical lift/sit to stand lift as needed for pt fatigue  transfers only;mechanical lift   Planned Treatment / Interventions  Plan for Next Visit  progress transfers  Mobility assessment, EOB   PT Discharge Summary  PT Disposition Recommendation(s)  --  STR (Short Term Rehab at a SNF rehab unit)   Plan of Care Overview/ Patient Status

## 2019-11-08 NOTE — Plan of Care
Adult Speech and Language PathologyInitial Clinical Swallow Evaluation 3/22/2021Patient Name:  Melinda Perryman BrengleMR#:  [id number] Date of Birth:  1952-06-17Therapist:  Sedalia Muta, MA,CCC-SLPSLP IP Adult Date of Visit/Evaluation - 11/08/19 1156    Date of Visit/Evaluation  Date of Visit / Treatment  11/08/19   Total Treatment Time  30   SLP IP Adult General Information - 11/08/19 1156    General Information  Subjective  I feel like I've been through the ringer   Referring Physician  (SLP)  Hospitalist   Medical Diagnosis  GI bleed   General Observations  Awake and alert. Sitting up in chair in ICU room on nasal canula.   Pertinent History of Current Problem  Patient is a 69 y/o FM with history of alcoholism admitted 11/03/2019 with blood per rectum and vomitting blood. She was intubated for GI/endoscopy 11/03/2019. She continued to remain intubated for concerns of alcohol withdrawl. An NG tube was inserted as well at that time. She was extubated 11/07/2019. Past medical history; alcoholism, essential tremor, GERD.   Language(s) spoken at home  English   Primary Concern  Thirsty   Prior Level of Functioning  regular diet    SLP IP Adult Oral/Motor Assessment - 11/08/19 1156    Oral/Motor Function  Labial Function  Mild Impairment   Labial ROM  Other (Comment)  tremor  Labial Symmetry  Within Functional Limits   Labial Strength  Reduced   Lingual Function  Mild Impairment   Lingual ROM  Within Functional Limits   Lingual Symmetry  Within Functional Limits   Lingual Strength  Reduced   Lingual Function Comments  tremor & reduced lingual strength to resistance. More significant lingual weakness to left side.   Velum  WFL - Within Functional Limits   Soft Palate   WFL - Within Functional Limits   Mandible  Within Functional Limits   Facial   WFL - Within Functional Limits   Dentition  Adequate   Volitional Cough / Throat Clear  Adequate   Laryngeal Elevation  Adequate   Overall Oral Motor Function Comments  mild/moderate tremor & weakness     SLP IP Adult 3 Ounce Water Challenge - 11/08/19 1156    Three Ounce Water Swallow Challenge  Water Swallow Challenge was Performed  Patient failed the challenge  Tested by cup. Patient had slight cough and tearing in eyes after swallow. Silent asipiration not ruled out.  Speech and Language Pathology follow up for:  Repeat 3 ounce swallow challenge     SLP IP Adult Clinical/Bedside Swallow Evaluation - 11/08/19 1156    Clinical / Bedside Swallow Evaluation  Type of Study  Initial   Reason for Study  aspiration risk;weak and de-conditioned  status post-extubation  Patients Current Diet  NPO   Patient Position  Chair;Upright   Patient Respiratory Status during evaluation - O2 Delivery  Nasal Cannula        O2 Rate  1 L/min        RR  16        SpO2  96%        Breathing Pattern  Nasal        Baseline Coughing  Present        Baseline Throat Clearing  Present        Baseline Breath/Vocal Quality  Normal   Saliva Swallows  WFL   Pre-Oral Skills / Observations  Impaired        Impaired  large bite size;difficulty using utensils (physical)  Ice Chips  no   Thin Liquid  yes        Delivery  Spoon;Cup;Straw;Fed w/ assist        Volume  various; patient failed 3oz water swallow challenge by cup, due to slight cough. Silent aspiration not ruled out.        Oral Phase  Successful cup drinking;Successful straw drinking;Able to strip bolus from spoon;Anterior leakage;Other  decreased labial seal to cup and anterior leakage       Pharyngeal Phase  Overt signs/symptoms of aspiration/difficulty  throat clearing to cup and straw. Slight cough after challenge testing by cup. Tolerated spoon sips well (no cough, no throat clearing).       Overt Signs / Symptoms of Aspiration / Difficulty  Cough;Throat clear;Wet vocal quality        Other Comments / Observations  Eye water noticed during 3oz water swallow challenge.   Nectar Thick Liquid  yes        Delivery  Straw;Cup;Fed w/ assist        Volume  various        Oral Phase  Adequate;Successful straw drinking;Successful cup drinking        Pharyngeal Phase  Overt signs/symptoms of aspiration/difficulty        Overt Signs / Symptoms of Aspiration / Difficulty  Throat clear   Honey Thick Liquid  no   Puree  yes        Delivery  Spoon;Fed by examiner  fed by examiner due to tremor & difficulty bringing spoon to mouth.       Volume  various        Oral Phase  Adequate;Able to strip bolus from spoon        Pharyngeal Phase  Overt signs/symptoms of aspiration/difficulty        Overt Signs / Symptoms of Aspiration / Difficulty  Throat clear  inconsistent delayed throat clear post-swallow.  Soft Solid  no   Regular Solid  no   Clinical Impression  Mild-Moderate  mild-moderate oral phase dysphagia. Suspected mild pharyngeal phase dysphagia. History of GERD and essential tremor. Patient very weak and tremulous..Silent aspiration not ruled out.  Clinical Impression Based On:  Decreased labial seal;Cough;Throat clear;Reduced oral strength/coordination/sensation;Wet vocal quality   Contributing Factors  Difficulty following directions   Predictors of Apsiration Pneumonia  Dependence for oral care/feeding;Hx of dysphagia or reflux  dependence on feeding assistance.  Patient should be NPO  No   Diet Recommendations  Nectar thick liquids  Full liquids. Nectar thickened. Straw ok. May have thin water by teaspoon only.  Medication Administration  whole  whole with applesauce or similar medium.  Recommendations  Sit upright with eating and drinking;Small sips / bites;Eat slowly   Patients feeding ability  Patient needs to be fed   Aspiration Precautions  yes        Precautions include:  Head of bed 90 degrees / Chair;Small bolus sizes;Slow feeding   Oral Care Recommendations  3x/day;Patient requires total assist for oral care   Adjust tube feeding to increase appetite and d/c tube feedings when PO adequate  no   Discontinue PO if patient experiences:  Increased temperature;Increased coughing;Increased fatigue   Follow Up  SLP           SLP IP Adult Recommendations - 11/08/19 1156    Recommendations  SLP Therapy Frequency  5x per week   Recommendations discussed with:  nurse;patient     Plan of Care Overview/ Patient Status:  Weak and tremulous. Overt s/s of dysphagia. Cleared for full liquids/nectar thickened. Straw Ok w/ small sips. Pills whole w/ apple sauce or similar. May have spoon sips of (thin) water on request. 1:1 feed.    Problem: Speech Language Pathology GoalsGoal: Dysphagia Goals, SLPOutcome: Initial problem identificationNote: Patient will maintain adequate hydration/ nutrition with optimim safety and efficiency of swallowing function on PO intake without pulmonary compromise for the highest appropriate diet level .Patient will swallow 3 ounces of water without stopping or coughing to assess risk of silent aspiration.

## 2019-11-09 LAB — COMPREHENSIVE METABOLIC PANEL
BKR ALANINE AMINOTRANSFERASE (ALT): 26 U/L (ref 12–78)
BKR ALBUMIN: 2.6 g/dL — ABNORMAL LOW (ref 3.4–5.0)
BKR ALKALINE PHOSPHATASE: 75 U/L (ref 45–117)
BKR ANION GAP (LM): 6 mmol/L (ref 5–15)
BKR ASPARTATE AMINOTRANSFERASE (AST): 22 U/L (ref 15–37)
BKR BILIRUBIN TOTAL: 0.5 mg/dL (ref 0.2–1.0)
BKR BLOOD UREA NITROGEN: 7 mg/dL (ref 7–18)
BKR CALCIUM: 8.3 mg/dL — ABNORMAL LOW (ref 8.5–10.1)
BKR CHLORIDE: 106 mmol/L (ref 98–107)
BKR CO2: 27 mmol/L (ref 21–32)
BKR CREATININE: 0.7 mg/dL (ref 0.55–1.02)
BKR EGFR (AFR AMER) (LMC): >60 mL/min/{1.73_m2} (ref >60)
BKR EGFR (NON AFR AMER) (LMC): >60 mL/min/{1.73_m2} (ref >60)
BKR GLOBULIN: 3.1 g/dL (ref 2.5–5.0)
BKR GLUCOSE: 105 mg/dL (ref 65–110)
BKR POTASSIUM: 3.4 mmol/L — ABNORMAL LOW (ref 3.5–5.1)
BKR PROTEIN TOTAL: 5.7 g/dL — ABNORMAL LOW (ref 6.4–8.2)
BKR SODIUM: 139 mmol/L (ref 136–145)

## 2019-11-09 LAB — PHOSPHORUS     (BH GH L LMW YH): BKR PHOSPHORUS: 3.3 mg/dL (ref 2.5–4.9)

## 2019-11-09 LAB — HEPATITIS A AB, TOTAL W/REFL IGM     (Q): HEPATITIS A AB, TOTAL W/REFL IGM: NONREACTIVE

## 2019-11-09 LAB — CBC WITH AUTO DIFFERENTIAL
BKR WAM ABSOLUTE IMMATURE GRANULOCYTES.: 0.07 x 1000/ÂµL (ref 0.00–0.10)
BKR WAM ABSOLUTE LYMPHOCYTE COUNT.: 0.93 x 1000/ÂµL — ABNORMAL LOW (ref 1.00–4.50)
BKR WAM ABSOLUTE NEUTROPHIL COUNT.: 5.21 x 1000/ÂµL (ref 1.40–6.60)
BKR WAM BASOPHIL ABSOLUTE COUNT.: 0.04 x 1000/ÂµL (ref 0.0–0.3)
BKR WAM BASOPHILS: 0.5 % (ref 0.0–3.0)
BKR WAM EOSINOPHIL ABSOLUTE COUNT.: 0.2 x 1000/ÂµL (ref 0.00–0.45)
BKR WAM EOSINOPHILS: 2.7 % (ref 0.0–4.0)
BKR WAM HEMATOCRIT: 28 % — ABNORMAL LOW (ref 34.0–45.0)
BKR WAM HEMOGLOBIN: 9.2 g/dL — ABNORMAL LOW (ref 11.0–15.0)
BKR WAM IMMATURE GRANULOCYTES: 0.9 % (ref 0.0–1.0)
BKR WAM LYMPHOCYTES: 12.4 % — ABNORMAL LOW (ref 25.0–45.0)
BKR WAM MCH PG: 30.7 pg (ref 27–33)
BKR WAM MCHC: 32.9 g/dL (ref 32.0–36.0)
BKR WAM MCV: 93.3 fL (ref 79.0–99.0)
BKR WAM MONOCYTE ABSOLUTE COUNT.: 1.02 x 1000/ÂµL (ref 0.00–1.20)
BKR WAM MONOCYTES: 13.7 % — ABNORMAL HIGH (ref 0.0–12.0)
BKR WAM MPV: 8.7 fL (ref 7.5–11.5)
BKR WAM NEUTROPHILS: 69.8 % — ABNORMAL HIGH (ref 36.0–66.0)
BKR WAM NUCLEATED RED BLOOD CELLS: 0 % (ref 0.0–5.0)
BKR WAM PLATELETS: 178 x1000/ÂµL (ref 140–400)
BKR WAM RDW-CV: 13 % (ref 11.5–14.5)
BKR WAM RED BLOOD CELL COUNT.: 3 M/ÂµL — ABNORMAL LOW (ref 4.00–5.20)
BKR WAM WHITE BLOOD CELL COUNT.: 7.47 x1000/ÂµL (ref 4.00–10.00)

## 2019-11-09 LAB — MAGNESIUM: BKR MAGNESIUM: 1.5 mg/dL — ABNORMAL LOW (ref 1.6–2.6)

## 2019-11-09 LAB — HEPATITIS B SURFACE ANTIGEN W/REFL CONFIRM-CHECK     (Q): HEPATITIS B SURFACE ANTIGEN: NONREACTIVE

## 2019-11-09 LAB — HEPATITIS B CORE ANTIBODY, TOTAL: HEPATITIS B CORE TOTAL ANTIBODY: NONREACTIVE

## 2019-11-09 MED ORDER — FERROUS GLUCONATE 324 MG (38 MG IRON) TABLET
32438 mg (38 mg iron) | ORAL | Status: DC
Start: 2019-11-09 — End: 2019-11-10
  Administered 2019-11-10: 13:00:00 324 mg (38 mg iron) via ORAL

## 2019-11-09 MED ORDER — POTASSIUM CHLORIDE ER 20 MEQ TABLET,EXTENDED RELEASE(PART/CRYST)
20 MEQ | Freq: Once | ORAL | Status: CP
Start: 2019-11-09 — End: ?
  Administered 2019-11-09: 18:00:00 20 MEQ via ORAL

## 2019-11-09 MED ORDER — HEPATITIS B VIRUS VACCINE RECOMB (PF) 20 MCG/ML INTRAMUSCULAR SYRINGE
20 mcg/mL | Freq: Once | INTRAMUSCULAR | Status: CP
Start: 2019-11-09 — End: ?
  Administered 2019-11-10: 01:00:00 20 mL via INTRAMUSCULAR

## 2019-11-09 MED ORDER — MULTIVITAMIN-IRON 9 MG-FOLIC ACID 400 MCG-CALCIUM AND MINERALS TABLET
9-400 mg iron-400 mcg | Freq: Every day | ORAL | Status: DC
Start: 2019-11-09 — End: 2019-11-10
  Administered 2019-11-10: 13:00:00 9 mg iron-400 mcg via ORAL

## 2019-11-09 MED ORDER — DIPHENHYDRAMINE 12.5 MG/5 ML ORAL ELIXIR
12.55 mg/5 mL | Freq: Four times a day (QID) | ORAL | Status: DC | PRN
Start: 2019-11-09 — End: 2019-11-10
  Administered 2019-11-10 (×2): 12.5 mL via ORAL

## 2019-11-09 MED ORDER — CHOLECALCIFEROL (VITAMIN D3) 25 MCG (1,000 UNIT) TABLET
25 mcg (1,000 unit) | Freq: Every day | ORAL | Status: DC
Start: 2019-11-09 — End: 2019-11-10
  Administered 2019-11-10: 13:00:00 25 mcg (1,000 unit) via ORAL

## 2019-11-09 MED ORDER — MAGNESIUM SULFATE 2 GRAM/50 ML (4 %) IN WATER INTRAVENOUS PIGGYBACK
2 gram/50 mL (4 %) | Freq: Once | INTRAVENOUS | Status: CP
Start: 2019-11-09 — End: ?
  Administered 2019-11-09: 18:00:00 2 mL/h via INTRAVENOUS

## 2019-11-09 NOTE — Plan of Care
Inpatient Occupational Therapy Evaluation Default Flowsheet Data (most recent)  IP Adult OT Eval/Treat - 11/09/19 1546    General Information  Pertinent History Of Current Problem  Pt is 69 yo female to ED/ICU on 11/03/19 due to vomitting and melena, ETOH dependence, GI bleed PMH of hypertension, dyslipidemia, essential tremor, class 2 obesity, generalized anxiety disorder, depression, and alcohol dependence and abuse    Subjective  Now is not a great time to check on that. I haven't been able to wash  my hair Pt coaxed into occupational therapy evaluation   General Observations  Pt supine in bed, foley catheter in place.  HUsband presently visiting and he in on the phone trying to rescheduled pt's second covid vaccine appt   Precautions/Limitations  fall precautions;bed alarm    Prior Level of Functioning/Social History  Prior Level of Function  independent with mobility;independent with ADLs   Patient resides with:  Spouse   Bathroom Setup  Raised toilet seat with or without arms;Shower/tub rails;Walk-in shower   Equipment Utilized Prior to [employer];Shower/Tub seat or bench;Shower/Tub rails   Additional Comments  Pt has small lip/step for her walk in shower.   She completes laundry, husband does cooking and grocery shopping.    Pain/Comfort  Location #1 - PreTreatment Rating (Numbers Scale)  0/10 - no pain   Posttreatment Rating (Numbers Scale)  0/10 - no pain    Patient Coping  Observed Emotional State  calm;cooperative    Cognition  Overall Cognitive Status  WFL   Orientation Level  Oriented X4    Range of Motion  Range of Motion Examination  bilateral upper extremity ROM was [facility]    Musculoskeletal  LUE Muscle Strength Grading  3-->active movement against gravity   RUE Muscle Strength Grading  4-->active movement against gravity and resistance   Musculoskeletal Comments  Pt reports prior L elbow fracture Balance  Sitting Balance: Static   GOOD     Maintains static position against moderate resistance with no Assistive Device   Sitting Balance: Dynamic   GOOD-   Independent in dynamic balance activities (ambulates on level surfaces with no Assistive Device, picks up object from floor in sitting)   Standing Balance: Static  FAIR+     Maintains static position without assist or device, may require Supervision or Verbal Cues (>2 minutes)   Standing Balance: Dynamic   FAIR      Performs dynamic activities through 75% range Contact Guard or partial range (50-75%) with Supervision   Balance Skills Training Comment  Functional mobliity with RW from bed to commode and back     AM-PAC - Daily Activity IP Short Form  Help needed from another person putting on/taking off regular lower body clothing  3 - A Little   Help needed from another person for bathing (incl. washing, rinsing, drying)  2 - A Lot   Help needed from another person for toileting (incl. using toilet, bedpan, urinal)  2 - A Lot   Help needed from another person putting on/taking off regular upper body clothing  3 - A Little   Help needed from another person taking care of personal grooming such as brushing teeth  4 - None   Help needed from another person eating meals  4 - None   AM-PAC Daily Activity Raw Score (Total of rows above)  18    Clinical Impression  Initial Assessment  Pt is 69 y/o female presesnting with decrease balance and endurance for ADls.  She needs mod-max assist for toileting and bathing. She needs min assist with LB dressing.  She would benefit from skilled occupational therapy for self care training and BUE strengthening to return to PLOF.  Reccomened STR to continue to address.    Planned Treatment / Interventions  Plan for Next Visit  morning ADls,     Plan of Care Overview/ Patient Status    Problem: Occupational Therapy GoalsGoal: Rehab Plan of Care Review, OTOutcome: Initial problem identificationGoal: Rehab Individuality and Mutuality, OTOutcome: Initial problem identificationGoal: Rehab Discharge Needs Assessment, OTOutcome: Initial problem identificationGoal: Occupational Therapy GoalsDescription: STGs (1 week)1. Pt will complete UB bathing and dressing with supervision2. Pt will complete toileting with min assist3. Pt will complete LB bathing and dressing with min assist4. Pt will complete grooming with set up Outcome: Initial problem identification

## 2019-11-09 NOTE — Plan of Care
Problem: Adult Inpatient Plan of CareGoal: Plan of Care ReviewOutcome: Interventions implemented as appropriateGoal: Patient-Specific Goal (Individualized)Outcome: Interventions implemented as appropriateGoal: Absence of Hospital-Acquired Illness or InjuryOutcome: Interventions implemented as appropriateGoal: Optimal Comfort and WellbeingOutcome: Interventions implemented as appropriateGoal: Readiness for Transition of CareOutcome: Interventions implemented as appropriate Problem: InfectionGoal: Infection Symptom ResolutionOutcome: Interventions implemented as appropriate Problem: Alcohol WithdrawalGoal: Alcohol Withdrawal Symptom ControlOutcome: Interventions implemented as appropriate Problem: Skin Injury Risk IncreasedGoal: Skin Health and IntegrityOutcome: Interventions implemented as appropriate Problem: Fall Injury RiskGoal: Absence of Fall and Fall-Related InjuryOutcome: Interventions implemented as appropriate Problem: Restraint, Nonbehavioral (Nonviolent)Goal: Discontinuation Criteria AchievedOutcome: Interventions implemented as appropriateGoal: Personal Dignity and Safety MaintainedOutcome: Interventions implemented as appropriate Problem: Physical Therapy GoalsGoal: Rehab Plan of Care Review, PTOutcome: Interventions implemented as appropriateGoal: Rehab Individuality and Mutuality, PTOutcome: Interventions implemented as appropriateGoal: Rehab Discharge Needs Assessment, PTOutcome: Interventions implemented as appropriateGoal: Physical Therapy GoalsDescription: 1. Pt to be (I) with HEP to improve functional mobility and balance upon discharge.2. Pt will be independent with all bed mobility to decrease risk of decubiti formation and to improve position of comfort.3. Pt will be min. A/CG with RW with all transfers so as to facilitate safety with ADL's.4.  Pt to ambulate 50' with RW with CG/min. A to facilitate safety with Wbing activities. Outcome: Interventions implemented as appropriate Problem: Speech Language Pathology GoalsGoal: Rehab Plan of Care Review, SLPOutcome: Interventions implemented as appropriateGoal: Rehab Individuality and Mutuality, SLPOutcome: Interventions implemented as appropriateGoal: Rehab Discharge Needs Assessment, SLPOutcome: Interventions implemented as appropriateGoal: Dysphagia Goals, SLPOutcome: Interventions implemented as appropriate Plan of Care Overview/ Patient Status    Pt A&Ox3, appropriate and cooperative. Tearful at times, therapeutic measures implemented. Attempted to wean pt to RA overnight, SpO2 dropped to high 80s, 1L n/c placed back on with good effect. Denies any pain, repositioned frequently. VSS.

## 2019-11-09 NOTE — Plan of Care
Plan of Care Overview/ Patient Status    A/O. VSS. OOB to chair.  Back and forth to bathroom with walker and standby assist. Maintaining sats on room air.  Tolerating meals.

## 2019-11-09 NOTE — Plan of Care
Plan of Care Overview/ Patient Status    SOCIAL WORK NOTEPatient Name: Melinda George Record Number: [id number] Date of Birth: Dec 26, 1952Social Work Follow Up    Most Recent Value Document Type  Progress Note (For Inpatient/ED Only) Prior psychosocial assessment has been documented within 30 days of this hospitalization  No Reason for Encounter  Symptoms/Risk of Misuse of Intoxicants Intervention  Substance Use Assessment & Referral Substance Use Assessment & Referral  Alcohol abuse assessment Source of Information  Medical Team Medical Team Comment  Melinda George is awake and alert, and distressed about her substance abuse Record Reviewed  Yes Level of Care  Inpatient Identified Clinical/Disposition, Issues/Barriers:  Melinda George presented to the ED, and was hospitalized in the ICU withdrawing from alcohol Intervention(s)/Summary  15 minutes spent face to face with Melinda George, as planned. Her husband Greggory Stallion was at bedside. Melinda George and Greggory Stallion request Melinda George as first choice for Skilled Nursing facility (SNF), with discharge planned for tomorrow. Case Management aware. Melinda George was able to clarify her substance abuse history when we spoke this afternoon. After Melinda George received treatment at [facility] she was sober for 5 years. However, she began drinking again about 3 years ago. Melinda George describes the treatment she received at [facility] one year ago as extremely good and she would like to go to their Intensive Outpatient Treatment when she discharges from the SNF. I encouraged her to discuss this with the social worker at the SNF, as [facility] typically has a waitlist, and it might take a week or more before Melinda George can intake at [facility]. Melinda George and Greggory Stallion agreed to follow up. Greggory Stallion also reports that Melinda George missed her second COVID vaccine on Sunday 3/21, and he has rescheduled it for April 1. Collaboration with Treatment Team/Community Providers/Family:  review with treatment team as above Outcome  Resolved Handoff Required?  No Barriers to Discharge  barrier(s) resolved Next Steps/Plan (including hand-off):  No further needs are known at this time. Please re-consult Social Work should additional needs be identified Signature:  Roxanna Mew, LICSW Contact Information:  [phone]

## 2019-11-09 NOTE — Plan of Care
Problem: Adult Inpatient Plan of CareGoal: Readiness for Transition of CareOutcome: Interventions implemented as appropriate Plan of Care Overview/ Patient Status    Patient AO x3 tearful but appropriate. Emotional Support offered.  Reviewed discharge plan with patient who is in agreement to discharge for STR before detox program.  Contacted STR regarding referrals placed yesterday.  SW aware patient tearful.  Will continue to follow CM Discharge Planning    Most Recent Value Discharge Planning Concerns to be Addressed  adjustment to diagnosis/illness, basic needs Barriers to Discharge  barrier(s) resolved Patient/Patient Representative was presented with a list of choices of facilities, agencies and/or dme providers  They had no preference Referral(s) placed for:  STR (short term rehab at a SNF rehab unit) Patient goals/treatment preference for discharge are:   discharge to STR via ambulance Home Health Care Services Required  N/A Patient is considered homebound due to: (he/she requires considerable and taxing effort to leave their residence for medical reasons or religious services OR infrequently OR of short duration for other reasons)  n/a Equipment Needed After Discharge  none Mode of Transportation   Ambulance (add comment for special considerations) Is the patient discharging to their home address  No If no, what is the discharging address  STR Designated Discharge Caregiver 1 Name designated caregiver  Lonia Chimera CM D/C Readiness PASRR completed and approved  N/A Authorization number obtained, if required  N/A Is there a 3 day INPATIENT Qualifying stay for Medicare Patients?  N/A DME Authorized/Delivered  N/A No needs identified/ follow up with PCP/MD  N/A Post acute care services secured W10 complete  Yes Pri Completed and Accepted   N/A Finalized Plan Post acute care services secured W10 complete  Yes  Maxie Better RN, BSN Case ManagementPhone: [phone]: [phone]

## 2019-11-09 NOTE — Plan of Care
Inpatient Physical Therapy Progress NoteIP Adult PT Eval/Treat - 11/09/19 1039    Date of Visit / Treatment  Date of Visit / Treatment  11/09/19   Total Treatment Time  23    General Information  Subjective  Pt agrees to PT, reports feeling teary this am   General Observations  pt supine in bed, foley & rectal tube in place+ 1L O2   Precautions/Limitations  fall precautions;bed alarm;chair alarm    Pain/Comfort  Location #1 - PreTreatment Rating (Numbers Scale)  0/10 - no pain   Posttreatment Rating (Numbers Scale)  0/10 - no pain   Pain Rating at Rest  0/10 - no pain   Pain Rating with Activity  0/10 - no pain    Bed Mobility  Symptoms Noted During/After Treatment  fatigue   Rolling/Turning Right - Independence/Assistance Level  Minimum assist;Assist of 1   Rolling/Turning Assist Device  Head of bed elevated   Supine-to-Sit Independence/Assistance Level  Minimum assist;Assist of 1   Supine-to-Sit Assist Device  Draw pad;Head of bed elevated   Limitations  decreased ability to use arms for pushing/pulling;decreased ability to use legs for bridging/pushing;impaired ability to control trunk for mobility   Bed Mobility, Impairments  strength decreased   Bed mobility was limited by:  impaired motor function    Sit-Stand Transfer Training  Symptoms Noted During/After Treatment (Sit-to-Stand Transfer Training)  fatigue   Sit-to-Stand Transfer Independence/Assistance Level  Minimum assist;Assist of 1   Sit-to-Stand Transfer Assist Device  Gait belt   Stand-to-Sit Transfer Independence/Assistance Level  Minimum assist;Assist of 1   Stand-to-Sit Transfer Assist Device  Gait belt   Transfer Safety Analysis Concerns  decreased balance during turns   Transfer Safety Analysis Impairments  impaired balance;decreased strength    Bed-Chair Transfer Training  Symptoms Noted During/After Treatment  fatigue   Bed-to-Chair Transfer Independence/Assistance Level  Moderate assist;Assist of 2   Bed-to-Chair Transfer Assist Device  Rolling walker;Gait belt  +1L O2  Transfer Safety Analysis Concerns  decreased balance during turns;losing balance backward;decreased weight-shifting ability   Transfer Safety Analysis Impairment  impaired balance;decreased strength    Handoff Documentation  Handoff  Patient in chair;Chair alarm;Patient instructed to call nursing for mobility    AM-PAC - Basic Mobility Screen- How much help from another person do you currently need.....  Turning from your back to your side while in a a flat bed without using rails?  2 - A Lot - Requires a lot of help (maximum to moderate assistance). Can use assistive devices.   Moving from lying on your back to sitting on the side of a flat bed without using bed rails?  2 - A Lot - Requires a lot of help (maximum to moderate assistance). Can use assistive devices.   Moving to and from a bed to a chair (including a wheelchair)?  2 - A Lot - Requires a lot of help (maximum to moderate assistance). Can use assistive devices.   Standing up from a chair using your arms(e.g., wheelchair or bedside chair)?  2 - A Lot - Requires a lot of help (maximum to moderate assistance). Can use assistive devices.   To walk in a hospital room?  1 - Total - Requires total assistance or cannot do it at all.   Climbing 3-5 steps with a railing?  1 - Total - Requires total assistance or cannot do it at all.   AMPAC Mobility Score  10   Highest Level of Mobility TARGET  Mobility Level  4,Transfer to chair    Lower Extremity Exercises  Supine Exercises  Quad sets;Glut sets;Ankle pumps;10x each    Trunk / Spine Exercises  Other Trunk / Spine Exercise  EOB sitting    Clinical Impression  Initial Assessment  pt presents w/ generalized weakness, limiting bed mobility & xfers. pt very unsteady in standing & lost balancew as she sidestepped to chair, needing assist to correct, O2 sats mid 90's during session    Planned Treatment / Interventions  Plan for Next Visit  trial gait    PT Discharge Summary  PT Disposition Recommendation(s)  STR (Short Term Rehab at a SNF rehab unit)    Plan of Care Overview/ Patient Status
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
## 2019-11-09 NOTE — Plan of Care
Adult Speech and Language PathologySwallow Treatment Session3/23/2021Patient Name:  Melinda Goldberger BrengleMR#:  [id number] Date of Birth:  1952/03/19Therapist:  Sedalia Muta, MA,CCC-SLPSLP IP Adult Date of Visit/Evaluation - 11/09/19 0858    Date of Visit/Evaluation  Date of Visit / Treatment  11/09/19   Total Treatment Time  25   SLP IP Adult General Information - 11/09/19 0858    General Information  Subjective   I am a little weepy today   General Observations  Patient gently crying upon arrival.  Reassurance provided.  Sitting upright in ICU bed with nasal cannula at 1 liter.   SLP IP Adult Pain/Comfort - 11/09/19 4098    Pain/Comfort  Location #1 - PreTreatment Rating (Numbers Scale)  0/10 - no pain      SLP IP Adult 3 Ounce Water Challenge - 11/09/19 0858    Three Ounce Water Swallow Challenge  Water Swallow Challenge was Performed  Patient passed:  drank in sequential boluses without signs of dysphagia  Tested by cup  Patients feeding ability  Patient is an independent feeder   Speech and Language Pathology follow up for:  Diet tolerance                  SLP Swallow Treatment Objective - 11/09/19 0858    *Treatment Objective  Treatment Objective  Increase swallow function;Tolerate PO regimen     SLP Swallow Overall Treatment Session Summary - 11/09/19 1191    Overall Treatment Session Comments  Overall Treatment Session Comments  Patient seen for therapeutic meal follow-up, clinical swallow reassessment and patient education.  Patient passed a 3 ounce water swallow challenge by cup:  Therefore risk of silent aspiration is low.  With serial swallowing by straw of thin liquids, there was a slight cough.  Patient was noted to be watching TV with chin slightly elevated while swallowing.  When taught to lower chin and to take single straw sip, there was no coughing.  She tolerated pills past by nursing with thin liquid by cup.  She tolerated soft solid.  There were no other instances of coughing or vocal weakness.  She is able to feed herself today    Dysphagia Treatment - 11/09/19 0858    Dysphagia/Swallow Treatment  Dysphagia Treatment  Swallowing   Dysphagia Follow Up  Patient is tolerating current PO regimen without overt signs/symptoms of dysphagia   Dysphagia Recommendations  Diet upgrade;Continue with aspiration precautions   Dysphagia Recommendations Comments  Advance diet to dental soft thin liquids.  Straws okay with single sips.   Medication Administration  whole;with liquid  1 to 2 pills at a time  Dysphagia Plan of Care  Continue to follow for diet tolerance         ; SLP IP Adult Recommendations - 11/09/19 0858    Recommendations  Recommendations discussed with:  patient;nurse;physician     Plan of Care Overview/ Patient Status:  Advance diet to dental soft, thin liquid.  Straws okay with small single sips.  Pills whole with water .  Aspiration precautions. Problem: Speech Language Pathology GoalsGoal: Dysphagia Goals, SLP3/23/2021 0909 by Sedalia Muta, SLPOutcome: Interventions implemented as appropriate3/23/2021 0909 by Sedalia Muta, SLPOutcome: Interventions implemented as appropriate3/23/2021 4782 by Sedalia Muta, SLPOutcome: Interventions implemented as appropriate
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
## 2019-11-09 NOTE — Progress Notes
General Medicine Progress NotePatient Data:  Patient Name: Melinda George Age: 69 y.o. DOB: 1951-04-03	 MRN: [id number]	 Hospital Medicine Progress NoteMohammad Mudassar Welton Flakes, MDSubjective: Subjective: No acute events overnightRemains NPO.  Remains on 1 liter via nasal cannulaThis morning, sleepy but arousable.  Answering questions14 point review of systems otherwise negative except as mentioned aboveReview of Allergies/Meds/Hx: I have reviewed the patient's allergies, prior to admission meds, past medical/surgical, family and social hx. Inpatient Medications:Scheduled Meds:Current Facility-Administered Medications Medication Dose Route Frequency Provider Last Rate Last Admin ? cefTRIAXone (ROCEPHIN) 1 g in water for injection, sterile 10 mL (100 mg/mL)  1 g IV Push Q24H Ferdie Ping, MD   1 g at 11/07/19 1703 ? chlorhexidine gluconate (PERIDEX) 0.12 % solution 15 mL  15 mL Mouth/Throat Q12H Ferdie Ping, MD   15 mL at 11/07/19 0912 ? cholecalciferol (vitamin D3) tablet 400 Units  400 Units Oral Daily Ferdie Ping, MD   400 Units at 11/07/19 0912 ? escitalopram oxalate (LEXAPRO) tablet 20 mg  20 mg Per NG tube Daily Alyse Low, APRN   20 mg at 11/07/19 5956 ? folic acid (FOLVITE) tablet 1 mg  1 mg Oral Daily Main, Marnee Spring, MD   1 mg at 11/07/19 3875 ? metoprolol tartrate (LOPRESSOR) Immediate Release tablet 50 mg  50 mg Per NG tube BID Alyse Low, APRN   50 mg at 11/07/19 6433 ? ondansetron (PF) (ZOFRAN) injection 4 mg  4 mg IV Push Once Nicholes Stairs, MD     ? pantoprazole (PROTONIX) 40 mg in sodium chloride 0.9% PF 10 mL (4 mg/mL)  40 mg IV Push Daily Phylliss Bob, MD   40 mg at 11/08/19 2951 ? sodium chloride 0.9 % flush 3 mL  3 mL IV Push Q8H Main, Marnee Spring, MD   3 mL at 11/08/19 0540 ? thiamine (VITAMIN B1) injection 100 mg  100 mg IV Push Daily Ferdie Ping, MD   100 mg at 11/08/19 0843 ? thiamine (VITAMIN B1) tablet 100 mg  100 mg Oral Daily Alyse Low, APRN   100 mg at 11/07/19 0912 Continuous Infusions:? midazolam (VERSED) 100 mg in 100 mL (1 mg/mL) infusion Stopped (11/07/19 0800) PRN Meds:.albuterol, ipratropium-albuteroL, LORazepam, LORazepam, morphine (ADULT), ondansetron, simethicone, sodium chlorideObjective: Vitals:Last 24 hours: Temp:  [97.5 ?F (36.4 ?C)-99.1 ?F (37.3 ?C)] 98.3 ?F (36.8 ?C)Pulse:  [74-99] 76Resp:  [16-30] 24BP: (121-159)/(27-87) 140/68SpO2:  [94 %-98 %] 95 %I/O's:I/O last 3 completed shifts:In: 120 [I.V.:20; NG/GT:100]Out: 2175 [Urine:1575; Other:600]I/O this shift:In: - Out: 275 [Urine:275]Height, Weight, BSA, BMIWeight: 104.4 kgHeight: 5' 6 (167.6 cm)BMI (kg/m2): 37.23[Retired] Body Surface Area (BSA) (m2): 2.2Physical Exam Constitutional: She appears well-developed and well-nourished. No distress. HENT: Head: Normocephalic and atraumatic. Neck: Normal range of motion. Neck supple. Cardiovascular: Normal rate, regular rhythm and normal heart sounds. Exam reveals no gallop and no friction rub. No murmur heard.Pulmonary/Chest: Effort normal and breath sounds normal. No respiratory distress. She has no wheezes. She has no rales. Abdominal: Soft. Bowel sounds are normal. She exhibits no distension. There is no abdominal tenderness. There is no rebound and no guarding. Musculoskeletal: Normal range of motion. Neurological: She is alert. Skin: Skin is warm and dry. She is not diaphoretic. Labs:[facility] 03/20/210647 03/21/210450 03/22/210551 WBC 6.48 5.73 7.06 HGB 9.0* 9.6* 9.0* HCT 28.2* 30.6* 27.8* PLT 139* 159 178 LFTsRecent Labs Lab 03/17/210406 03/18/210555 ALT 75 45 AST 82* 34 ALKPHOS 96 68 BILITOT 0.4 0.5 BILIDIR <0.10  --  LIPASENo results for input(s): LIPASE in the last 168 hours.  BMPR[facility] Labs Lab 03/18/210555 03/18/210606 03/18/210606 03/20/210603 03/21/210449 03/22/210551 NA 142  --    < > 144 144 144 K 4.0  --    < > 3.4* 3.3* 3.4* CL 109*  --    < > 116* 113* 111* CO2 24  --    < > 20* 22 25 BUN 23*  --    < > 10 11 8  CREATININE 1.34*  --    < > 0.91 0.79 0.75 CALCIUM 7.9*  --    < > 7.8* 8.1* 8.4* MG 1.7  --   --  1.9 1.8  --  PHOS  --  3.3  --  1.8* 3.6  --   < > = values in this interval not displayed. CoagsRecent Labs Lab 03/17/210406 INR 1.0  Cardiac EnzymesNo results for input(s): TROPONINI, CKTOTAL, CKMB in the last 168 hours.Invalid input(s): TROPONINPOC pBNPNo components found for: PROBNP LipidsLab Results Component Value Date  CHOL 181 10/05/2018  HDL 65 (H) 10/05/2018  LDL 91 10/05/2018  TRIG 127 10/05/2018  A1CNo results found for: HGBA1C Other Labs No results for input(s): TSH in the last 168 hours.GlucoseRecent Labs Lab 03/17/210406 03/18/210555 03/19/210600 03/20/210603 03/21/210449 03/22/210551 GLU 121* 138* 104 100 104 85  MicrobiologyBlood cultures: Lab Results Component Value Date  LABBLOO No Growth to Date 11/03/2019  LABBLOO No Growth to Date 11/03/2019 Respiratory cultures:Results for orders placed or performed during the hospital encounter of 11/03/19 Lower respiratory culture, qualitative  Collection Time: 11/05/19  8:13 AM  Specimen: Sputum; Culture Result Value  Lower Respiratory Culture 2+ Normal Flora  Gram Stain (Primary) 4+ WBC's  Gram Stain (Primary) <10 EPI/LPF  Gram Stain (Primary) 1+ Gram positive cocci in pairs  Gram Stain (Primary) 1+ Gram positive rods  Gram Stain (Primary) 1+ Gram negative rods Wound cultures:No results found for this or any previous visit. No results found for: CRPNo results found for: ESRSED Diagnostics:I have reviewed the patient's Radiology report(s) within the last 48 hrs. Esophagogastroduodenoscopy with biopsy1. Normal appearing small bowel D1 to D3/D4 no evidence of coffee-grounds no AVMs no evidence of source of GI bleeding.  Major papilla was identified no evidence of blood2. Normal pylorus3. Nonbleeding crater gastric ulcers with no stigmata or bleeding were found in the gastric antrum and pre-pyloric region of the stomach.  The largest was  5 millimeters in diameter ER but deeply crater did biopsies were taken there was no evidence of hematin spot visible vessel or recent bleeding.  There were other ulcers in the antrum and pre-pyloric region ranging from 2 to 5 millimeters but not as significantly crated.  For for documentation of the larger ulcer was noted.  Biopsies were taken4. There were also gastric body ulcers with erosions and some heme but no evidence of significant source of bleeding.5. Diffuse gastritis is noted with congestion erythema biopsy the gastric body were taken6. There was heme in the form of coffee grounds in the fundus region of the gastric lumen but again no evidence of source of this blood.7. Excellent visit is a shin of the GE junction on the gastric side without evidence of Mallory-Weiss tear8. Squamocolumnar junction was located at 38 centimeters with distal esophageal ulceration 0 to 1+ esophageal varices without evidence of source of GI bleeding.  The varices were really inconsequential photo documentation is obtained.  There was no hematin spots no cherry-red spot no source of bleeding could be seen in the esophagus the rest of the esophageal mucosa was normal biopsies were takenAssessment: Ms. Offie  ROCHELLE LARUE is a 69 y.o. year old female with a PMH significant for alcohol abuse, hypertension, GERD, hyperlipidemia and obesity, now admitted with UTI, lactic acidosis and GI bleedPlan: #GI bleed - hematemesis EGD 3/17 consistent with gastric ulcers, diffuse gastritis and versus without bleed.  No evidence of Mallory-Weiss tearOctreotide has been discontinued.  Colonoscopy with probable diverticular bleedReceived 1 unit of PRBC on 03/18.  Hemoglobin remained stableAFP normal.  Continue Protonix daily#Urinary tract infectionUrine culture consistent with Klebsiella pneumonia, pansensitiveContinue ceftriaxone (day 6)Blood cultures remain negative#Acute hypoxic respiratory failureIntubated on 3/17 for airway protection.  Extubated on 3/21Sputum culture consistent with 2+ normal floraWeaned down to 1 liter by nasal cannula, saturating 95%#Alcohol withdrawalNot actively withdrawing.  Monitor on CIWA protocolWernicke's prophylaxis#Acute kidney injury#Lactic acidosisResolved with supportive managementDiet:  NPO.  Awaiting swallow evalVTE prophylaxis:  SCDsCode status:  Full codeCare/plan discussed with patient at the bedside, who understands and agrees with the above plan.I have confirmed that the patient's advanced care plan is present, code status is documented, or surrogate decision maker is listed in the patient's medical record.I have utilized all available immediate resources to obtain, update, or review the patient's current medications.Signed:Melton Alar, MDBeeper: [phone]/[id number] report was created in part from templates and voice recognition software. Typographical and minor dictation errors may be present.

## 2019-11-09 NOTE — Progress Notes
General Medicine Progress NotePatient Data:  Patient Name: Melinda George Age: 69 y.o. DOB: December 05, 1950	 MRN: [id number]	 Hospital Medicine Progress NoteMohammad Mudassar Welton Flakes, MDSubjective: Subjective: No acute events overnightPassed swallow evaluation this morningDoing well, denies any complaints14 point review of systems otherwise negative except as mentioned aboveReview of Allergies/Meds/Hx: I have reviewed the patient's allergies, prior to admission meds, past medical/surgical, family and social hx. Inpatient Medications:Scheduled Meds:Current Facility-Administered Medications Medication Dose Route Frequency Provider Last Rate Last Admin ? cefTRIAXone (ROCEPHIN) 1 g in water for injection, sterile 10 mL (100 mg/mL)  1 g IV Push Q24H Renda Rolls, MD   1 g at 11/08/19 1750 ? cholecalciferol (vitamin D3) tablet 400 Units  400 Units Oral Daily Ferdie Ping, MD   400 Units at 11/09/19 0847 ? escitalopram oxalate (LEXAPRO) tablet 20 mg  20 mg Oral Daily Renda Rolls, MD   20 mg at 11/09/19 0847 ? folic acid (FOLVITE) tablet 1 mg  1 mg Oral Daily Main, Marnee Spring, MD   1 mg at 11/09/19 2130 ? magnesium sulfate in water 2 gram/50 mL (4 %) (IVPB) 2 g  2 g Intravenous Once Renda Rolls, MD     ? metoprolol tartrate (LOPRESSOR) Immediate Release tablet 50 mg  50 mg Oral BID Renda Rolls, MD   50 mg at 11/09/19 8657 ? pantoprazole (PROTONIX) 40 mg in sodium chloride 0.9% PF 10 mL (4 mg/mL)  40 mg IV Push Daily Phylliss Bob, MD   40 mg at 11/09/19 0847 ? potassium chloride SA (K-DUR,KLOR-CON M) 24 hr tablet 20 mEq  20 mEq Oral Once Renda Rolls, MD     ? sodium chloride 0.9 % flush 3 mL  3 mL IV Push Q8H Main, Marnee Spring, MD   3 mL at 11/08/19 0540 ? thiamine (VITAMIN B1) tablet 100 mg  100 mg Oral Daily Alyse Low, APRN   100 mg at 11/09/19 0847 Continuous Infusions: PRN Meds:.albuterol, ipratropium-albuteroL, LORazepam, LORazepam, morphine (ADULT), ondansetron, sodium chlorideObjective: Vitals:Last 24 hours: Temp:  [97.4 ?F (36.3 ?C)-99.9 ?F (37.7 ?C)] 99.9 ?F (37.7 ?C)Pulse:  [73-93] 88Resp:  [16-29] 17BP: (133-159)/(54-83) 159/83SpO2:  [89 %-97 %] 96 %I/O's:I/O last 3 completed shifts:In: 560 [P.O.:360; IV Piggyback:200]Out: 1975 [Urine:1975]No intake/output data recorded.Height, Weight, BSA, BMIWeight: 104.4 kgHeight: 5' 6 (167.6 cm)BMI (kg/m2): 37.23[Retired] Body Surface Area (BSA) (m2): 2.2Physical Exam Constitutional: She appears well-developed and well-nourished. No distress. HENT: Head: Normocephalic and atraumatic. Neck: Normal range of motion. Neck supple. Cardiovascular: Normal rate, regular rhythm and normal heart sounds. Exam reveals no gallop and no friction rub. No murmur heard.Pulmonary/Chest: Effort normal and breath sounds normal. No respiratory distress. She has no wheezes. She has no rales. Abdominal: Soft. Bowel sounds are normal. She exhibits no distension. There is no abdominal tenderness. There is no rebound and no guarding. Musculoskeletal: Normal range of motion. Neurological: She is alert. Skin: Skin is warm and dry. She is not diaphoretic. Labs:CBCRecent Labs Lab 03/21/210450 03/22/210551 03/23/210554 WBC 5.73 7.06 7.47 HGB 9.6* 9.0* 9.2* HCT 30.6* 27.8* 28.0* PLT 159 178 178 LFTsRecent Labs Lab 03/17/210406 03/18/210555 03/23/210554 ALT 75 45 26 AST 82* 34 22 ALKPHOS 96 68 75 BILITOT 0.4 0.5 0.5 BILIDIR <0.10  --   --  LIPASENo results for input(s): LIPASE in the last 168 hours. BMPRecent Labs Lab 03/20/210603 03/21/210449 03/22/210551 03/23/210554 NA 144 144 144 139 K 3.4* 3.3* 3.4* 3.4* CL 116* 113* 111* 106 CO2 20* 22 25 27  BUN 10 11 8 7  CREATININE 0.91 0.79 0.75 0.70 CALCIUM 7.8* 8.1*  8.4* 8.3* MG 1.9 1.8  --  1.5* PHOS 1.8* 3.6  --  3.3 CoagsRecent Labs Lab 03/17/210406 INR 1.0  Cardiac EnzymesNo results for input(s): TROPONINI, CKTOTAL, CKMB in the last 168 hours.Invalid input(s): TROPONINPOC pBNPNo components found for: PROBNP LipidsLab Results Component Value Date  CHOL 181 10/05/2018  HDL 65 (H) 10/05/2018  LDL 91 10/05/2018  TRIG 127 10/05/2018  A1CNo results found for: HGBA1C Other Labs No results for input(s): TSH in the last 168 hours.GlucoseRecent Labs Lab 03/18/210555 03/19/210600 03/20/210603 03/21/210449 03/22/210551 03/23/210554 GLU 138* 104 100 104 85 105  MicrobiologyBlood cultures: Lab Results Component Value Date  LABBLOO No Growth after 5 days of incubation 11/03/2019  LABBLOO No Growth after 5 days of incubation 11/03/2019 Respiratory cultures:Results for orders placed or performed during the hospital encounter of 11/03/19 Lower respiratory culture, qualitative  Collection Time: 11/05/19  8:13 AM  Specimen: Sputum; Culture Result Value  Lower Respiratory Culture 2+ Normal Flora  Gram Stain (Primary) 4+ WBC's  Gram Stain (Primary) <10 EPI/LPF  Gram Stain (Primary) 1+ Gram positive cocci in pairs  Gram Stain (Primary) 1+ Gram positive rods  Gram Stain (Primary) 1+ Gram negative rods Wound cultures:No results found for this or any previous visit. No results found for: CRPNo results found for: ESRSED Diagnostics:I have reviewed the patient's Radiology report(s) within the last 48 hrs.Esophagogastroduodenoscopy with biopsy1. Normal appearing small bowel D1 to D3/D4 no evidence of coffee-grounds no AVMs no evidence of source of GI bleeding.  Major papilla was identified no evidence of blood2. Normal pylorus 3. Nonbleeding crater gastric ulcers with no stigmata or bleeding were found in the gastric antrum and pre-pyloric region of the stomach.  The largest was  5 millimeters in diameter ER but deeply crater did biopsies were taken there was no evidence of hematin spot visible vessel or recent bleeding.  There were other ulcers in the antrum and pre-pyloric region ranging from 2 to 5 millimeters but not as significantly crated.  For for documentation of the larger ulcer was noted.  Biopsies were taken4. There were also gastric body ulcers with erosions and some heme but no evidence of significant source of bleeding.5. Diffuse gastritis is noted with congestion erythema biopsy the gastric body were taken6. There was heme in the form of coffee grounds in the fundus region of the gastric lumen but again no evidence of source of this blood.7. Excellent visit is a shin of the GE junction on the gastric side without evidence of Mallory-Weiss tear8. Squamocolumnar junction was located at 38 centimeters with distal esophageal ulceration 0 to 1+ esophageal varices without evidence of source of GI bleeding.  The varices were really inconsequential photo documentation is obtained.  There was no hematin spots no cherry-red spot no source of bleeding could be seen in the esophagus the rest of the esophageal mucosa was normal biopsies were takenAssessment: Melinda George is a 69 y.o. year old female with a PMH significant for alcohol abuse, hypertension, GERD, hyperlipidemia and obesity, now admitted with UTI, lactic acidosis and GI bleedPlan: #GI bleed - hematemesisEGD 3/17 consistent with gastric ulcers, diffuse gastritis and versus without bleed.  No evidence of Mallory-Weiss tearOctreotide discontinued.  Colonoscopy with probable diverticular bleedReceived 1 unit of PRBC on 3/18.  Hemoglobin stableAFP normal.  Continue Protonix daily#Urinary tract infection Urine culture consistent with Klebsiella pneumonia, pansensitiveContinue ceftriaxone (Day 7 of 7)Blood cultures remain negative#Acute hypoxic respiratory failureIntubated on 3/17 for airway protection.  Extubated on 3/21Sputum culture consistent with 2+ normal floraWeaned  down to 1 liter by nasal cannula, saturating 95%#Alcohol withdrawalNot actively withdrawing.  Discontinue CIWA protocolWernicke's prophylaxis#Acute kidney injury#Lactic acidosisResolved with supportive managementDiet:  Dental softVTE prophylaxis:  SCDsCode status:  Full codeCare/plan discussed with patient at the bedside, who understands and agrees with the above plan.Discussed with husband over the phone, as well. I have confirmed that the patient's advanced care plan is present, code status is documented, or surrogate decision maker is listed in the patient's medical record.I have utilized all available immediate resources to obtain, update, or review the patient's current medications.Signed:Melton Alar, MDBeeper: [phone] report was created in part from templates and voice recognition software. Typographical and minor dictation errors may be present.

## 2019-11-10 DIAGNOSIS — E785 Hyperlipidemia, unspecified: Secondary | ICD-10-CM

## 2019-11-10 DIAGNOSIS — Z87891 Personal history of nicotine dependence: Secondary | ICD-10-CM

## 2019-11-10 DIAGNOSIS — J9601 Acute respiratory failure with hypoxia: Secondary | ICD-10-CM

## 2019-11-10 DIAGNOSIS — Z90710 Acquired absence of both cervix and uterus: Secondary | ICD-10-CM

## 2019-11-10 DIAGNOSIS — K8020 Calculus of gallbladder without cholecystitis without obstruction: Secondary | ICD-10-CM

## 2019-11-10 DIAGNOSIS — E872 Acidosis: Secondary | ICD-10-CM

## 2019-11-10 DIAGNOSIS — Z6837 Body mass index (BMI) 37.0-37.9, adult: Secondary | ICD-10-CM

## 2019-11-10 DIAGNOSIS — K648 Other hemorrhoids: Secondary | ICD-10-CM

## 2019-11-10 DIAGNOSIS — K635 Polyp of colon: Secondary | ICD-10-CM

## 2019-11-10 DIAGNOSIS — B961 Klebsiella pneumoniae [K. pneumoniae] as the cause of diseases classified elsewhere: Secondary | ICD-10-CM

## 2019-11-10 DIAGNOSIS — N390 Urinary tract infection, site not specified: Secondary | ICD-10-CM

## 2019-11-10 DIAGNOSIS — F329 Major depressive disorder, single episode, unspecified: Secondary | ICD-10-CM

## 2019-11-10 DIAGNOSIS — E876 Hypokalemia: Secondary | ICD-10-CM

## 2019-11-10 DIAGNOSIS — F411 Generalized anxiety disorder: Secondary | ICD-10-CM

## 2019-11-10 DIAGNOSIS — K920 Hematemesis: Secondary | ICD-10-CM

## 2019-11-10 DIAGNOSIS — K2970 Gastritis, unspecified, without bleeding: Secondary | ICD-10-CM

## 2019-11-10 DIAGNOSIS — I10 Essential (primary) hypertension: Secondary | ICD-10-CM

## 2019-11-10 DIAGNOSIS — F10239 Alcohol dependence with withdrawal, unspecified: Secondary | ICD-10-CM

## 2019-11-10 DIAGNOSIS — I8500 Esophageal varices without bleeding: Secondary | ICD-10-CM

## 2019-11-10 DIAGNOSIS — K219 Gastro-esophageal reflux disease without esophagitis: Secondary | ICD-10-CM

## 2019-11-10 DIAGNOSIS — Z79899 Other long term (current) drug therapy: Secondary | ICD-10-CM

## 2019-11-10 DIAGNOSIS — N179 Acute kidney failure, unspecified: Secondary | ICD-10-CM

## 2019-11-10 DIAGNOSIS — K5731 Diverticulosis of large intestine without perforation or abscess with bleeding: Secondary | ICD-10-CM

## 2019-11-10 DIAGNOSIS — K259 Gastric ulcer, unspecified as acute or chronic, without hemorrhage or perforation: Secondary | ICD-10-CM

## 2019-11-10 DIAGNOSIS — G250 Essential tremor: Secondary | ICD-10-CM

## 2019-11-10 DIAGNOSIS — R3121 Asymptomatic microscopic hematuria: Secondary | ICD-10-CM

## 2019-11-10 DIAGNOSIS — E860 Dehydration: Secondary | ICD-10-CM

## 2019-11-10 DIAGNOSIS — K2210 Ulcer of esophagus without bleeding: Secondary | ICD-10-CM

## 2019-11-10 DIAGNOSIS — K760 Fatty (change of) liver, not elsewhere classified: Secondary | ICD-10-CM

## 2019-11-10 DIAGNOSIS — E559 Vitamin D deficiency, unspecified: Secondary | ICD-10-CM

## 2019-11-10 DIAGNOSIS — D638 Anemia in other chronic diseases classified elsewhere: Secondary | ICD-10-CM

## 2019-11-10 DIAGNOSIS — Z20822 Contact with and (suspected) exposure to covid-19: Secondary | ICD-10-CM

## 2019-11-10 LAB — CBC WITH AUTO DIFFERENTIAL
BKR WAM ABSOLUTE IMMATURE GRANULOCYTES.: 0.07 x 1000/ÂµL (ref 0.00–0.10)
BKR WAM ABSOLUTE LYMPHOCYTE COUNT.: 1 x 1000/ÂµL (ref 1.00–4.50)
BKR WAM ABSOLUTE NEUTROPHIL COUNT.: 5.37 x 1000/ÂµL (ref 1.40–6.60)
BKR WAM BASOPHIL ABSOLUTE COUNT.: 0.05 x 1000/ÂµL (ref 0.0–0.3)
BKR WAM BASOPHILS: 0.6 % (ref 0.0–3.0)
BKR WAM EOSINOPHIL ABSOLUTE COUNT.: 0.19 x 1000/ÂµL (ref 0.00–0.45)
BKR WAM EOSINOPHILS: 2.4 % (ref 0.0–4.0)
BKR WAM HEMATOCRIT: 29.5 % — ABNORMAL LOW (ref 34.0–45.0)
BKR WAM HEMOGLOBIN: 9.7 g/dL — ABNORMAL LOW (ref 11.0–15.0)
BKR WAM IMMATURE GRANULOCYTES: 0.9 % (ref 0.0–1.0)
BKR WAM LYMPHOCYTES: 12.6 % — ABNORMAL LOW (ref 25.0–45.0)
BKR WAM MCH PG: 30.6 pg (ref 27–33)
BKR WAM MCHC: 32.9 g/dL (ref 32.0–36.0)
BKR WAM MCV: 93.1 fL (ref 79.0–99.0)
BKR WAM MONOCYTE ABSOLUTE COUNT.: 1.27 x 1000/ÂµL — ABNORMAL HIGH (ref 0.00–1.20)
BKR WAM MONOCYTES: 16 % — ABNORMAL HIGH (ref 0.0–12.0)
BKR WAM MPV: 9.2 fL (ref 7.5–11.5)
BKR WAM NEUTROPHILS: 67.5 % — ABNORMAL HIGH (ref 36.0–66.0)
BKR WAM NUCLEATED RED BLOOD CELLS: 0 % (ref 0.0–5.0)
BKR WAM PLATELETS: 244 x1000/ÂµL (ref 140–400)
BKR WAM RDW-CV: 12.8 % (ref 11.5–14.5)
BKR WAM RED BLOOD CELL COUNT.: 3.17 M/ÂµL — ABNORMAL LOW (ref 4.00–5.20)
BKR WAM WHITE BLOOD CELL COUNT.: 7.95 x1000/ÂµL (ref 4.00–10.00)

## 2019-11-10 LAB — AMMONIA     (BH GH L LMW YH): BKR AMMONIA: 18 umol/L (ref 11–32)

## 2019-11-10 LAB — BASIC METABOLIC PANEL
BKR ANION GAP (LM): 6 mmol/L (ref 5–15)
BKR BLOOD UREA NITROGEN: 8 mg/dL (ref 7–18)
BKR CALCIUM: 8.7 mg/dL (ref 8.5–10.1)
BKR CHLORIDE: 105 mmol/L (ref 98–107)
BKR CO2: 28 mmol/L (ref 21–32)
BKR CREATININE: 0.77 mg/dL (ref 0.55–1.02)
BKR EGFR (AFR AMER) (LMC): >60 mL/min/{1.73_m2} (ref >60)
BKR EGFR (NON AFR AMER) (LMC): >60 mL/min/{1.73_m2} (ref >60)
BKR GLUCOSE: 113 mg/dL — ABNORMAL HIGH (ref 65–110)
BKR POTASSIUM: 3.5 mmol/L (ref 3.5–5.1)
BKR SODIUM: 139 mmol/L (ref 136–145)

## 2019-11-10 LAB — PHOSPHORUS     (BH GH L LMW YH): BKR PHOSPHORUS: 2.9 mg/dL (ref 2.5–4.9)

## 2019-11-10 LAB — MAGNESIUM: BKR MAGNESIUM: 2 mg/dL (ref 1.6–2.6)

## 2019-11-10 LAB — COVID-19 CLEARANCE OR FOR PLACEMENT ONLY: BKR SARS-COV-2 RNA (COVID-19) (YH): NEGATIVE

## 2019-11-10 MED ORDER — FERROUS GLUCONATE 324 MG (38 MG IRON) TABLET
32438 mg (38 mg iron) | ORAL | Status: AC
Start: 2019-11-10 — End: 2019-11-26

## 2019-11-10 MED ORDER — PANTOPRAZOLE 40 MG TABLET,DELAYED RELEASE
40 mg | Freq: Every day | ORAL | Status: AC
Start: 2019-11-10 — End: ?

## 2019-11-10 MED ORDER — PANTOPRAZOLE 40 MG TABLET,DELAYED RELEASE
40 mg | Freq: Every day | ORAL | Status: DC
Start: 2019-11-10 — End: 2019-11-10

## 2019-11-10 NOTE — Care Coordination-Inpatient
[facility] completed and faxed to [facility].  Had a BM today.  No auth needed.Darliss Ridgel RN, MSNCase ManagementWesterly [phone]

## 2019-11-10 NOTE — Discharge Instructions
Patient Education Gastrointestinal BleedingGastrointestinal (GI) bleeding is bleeding somewhere along the path that food travels through the body (digestive tract). This path is anywhere between the mouth and the opening of the butt (anus). You may have blood in your poop (stool) or have black poop. If you throw up (vomit), there may be blood in it.This condition can be mild, serious, or even life-threatening. If you have a lot of bleeding, you may need to stay in the hospital.What are the causes?This condition may be caused by:?	Irritation and swelling of the esophagus (esophagitis). The esophagus is part of the body that moves food from your mouth to your stomach.?	Swollen veins in the butt (hemorrhoids).?	Areas of painful tearing in the opening of the butt (anal fissures). These are often caused by passing hard poop.?	Pouches that form on the colon over time (diverticulosis).?	Irritation and swelling (diverticulitis) in areas where pouches have formed on the colon.?	Growths (polyps) or cancer. Colon cancer often starts out as growths that are not cancer.?	Irritation of the stomach lining (gastritis).?	Sores (ulcers) in the stomach.What increases the risk?You are more likely to develop this condition if you:?	Have a certain type of infection in your stomach (Helicobacter pylori infection).?	Take certain medicines.?	Smoke.?	Drink alcohol.What are the signs or symptoms?Common symptoms of this condition include:?	Throwing up (vomiting) material that has bright red blood in it. It may look like coffee grounds.?	Changes in your poop. The poop may:?	Have red blood in it.?	Be black, look like tar, and smell stronger than normal.?	Be red.?	Pain or cramping in the belly (abdomen).How is this treated?Treatment for this condition depends on the cause of the bleeding. For example:?	Sometimes, the bleeding can be stopped during a procedure that is done to find the problem (endoscopy or colonoscopy).?	Medicines can be used to:?	Help control irritation, swelling, or infection.?	Reduce acid in your stomach.?	Certain problems can be treated with:?	Creams.?	Medicines that are put in the butt (suppositories).?	Warm baths.?	Surgery is sometimes needed.?	If you lose a lot of blood, you may need a blood transfusion.If bleeding is mild, you may be allowed to go home. If there is a lot of bleeding, you will need to stay in the hospital.Follow these instructions at home:?	Take over-the-counter and prescription medicines only as told by your doctor.?	Eat foods that have a lot of fiber in them. These foods include beans, whole grains, and fresh fruits and vegetables. You can also try eating 1-3 prunes each day.?	Drink enough fluid to keep your pee (urine) pale yellow.?	Keep all follow-up visits as told by your doctor. This is important.Contact a doctor if:?	Your symptoms do not get better.Get help right away if:?	Your bleeding does not stop.?	You feel dizzy or you pass out (faint).?	You feel weak.?	You have very bad cramps in your back or belly.?	You pass large clumps of blood (clots) in your poop.?	Your symptoms are getting worse.?	You have chest pain or fast heartbeats.Summary?	GI bleeding is bleeding somewhere along the path that food travels through the body (digestive tract).?	This bleeding can be caused by many things. Treatment depends on the cause of the bleeding.?	Take medicines only as told by your doctor.?	Keep all follow-up visits as told by your doctor. This is important.This information is not intended to replace advice given to you by your health care provider. Make sure you discuss any questions you have with your health care provider.Document Released: 05/14/2008 Document Revised: 03/18/2018 Document Reviewed: 07/31/2019Elsevier Patient Education ? 2020 [employer]

## 2019-11-10 NOTE — Other
Pt admitted for GIB. EGD/Colonoscopy done. Source believed to be diverticular. No active bleed found. Pt was intubated for airway protection and etoh withdrawal. Pt standby assist in room with walker. Nurse to nurse report given.

## 2019-11-10 NOTE — Plan of Care
Plan of Care Overview/ Patient Status    Problem: Adult Inpatient Plan of CareGoal: Plan of Care ReviewOutcome: Outcome(s) achievedGoal: Patient-Specific Goal (Individualized)Outcome: Outcome(s) achievedGoal: Absence of Hospital-Acquired Illness or InjuryOutcome: Outcome(s) achievedGoal: Optimal Comfort and WellbeingOutcome: Outcome(s) achievedGoal: Readiness for Transition of CareOutcome: Outcome(s) achieved Problem: InfectionGoal: Infection Symptom ResolutionOutcome: Outcome(s) achieved Problem: Alcohol WithdrawalGoal: Alcohol Withdrawal Symptom ControlOutcome: Outcome(s) achieved Problem: Skin Injury Risk IncreasedGoal: Skin Health and IntegrityOutcome: Outcome(s) achieved Problem: Fall Injury RiskGoal: Absence of Fall and Fall-Related InjuryOutcome: Outcome(s) achieved Problem: Restraint, Nonbehavioral (Nonviolent)Goal: Discontinuation Criteria AchievedOutcome: Outcome(s) achievedGoal: Personal Dignity and Safety MaintainedOutcome: Outcome(s) achieved Problem: Physical Therapy GoalsGoal: Rehab Plan of Care Review, PTOutcome: Outcome(s) achievedGoal: Rehab Individuality and Mutuality, PTOutcome: Outcome(s) achievedGoal: Rehab Discharge Needs Assessment, PTOutcome: Outcome(s) achievedGoal: Physical Therapy GoalsDescription: 1. Pt to be (I) with HEP to improve functional mobility and balance upon discharge.2. Pt will be independent with all bed mobility to decrease risk of decubiti formation and to improve position of comfort.3. Pt will be min. A/CG with RW with all transfers so as to facilitate safety with ADL's.4.  Pt to ambulate 45' with RW with CG/min. A to facilitate safety with Wbing activities. Outcome: Outcome(s) achieved Problem: Speech Language Pathology GoalsGoal: Rehab Plan of Care Review, SLPOutcome: Outcome(s) achievedGoal: Rehab Individuality and Mutuality, SLPOutcome: Outcome(s) achievedGoal: Rehab Discharge Needs Assessment, SLPOutcome: Outcome(s) achievedGoal: Dysphagia Goals, SLPOutcome: Outcome(s) achieved Problem: Occupational Therapy GoalsGoal: Rehab Plan of Care Review, OTOutcome: Outcome(s) achievedGoal: Rehab Individuality and Mutuality, OTOutcome: Outcome(s) achievedGoal: Rehab Discharge Needs Assessment, OTOutcome: Outcome(s) achievedGoal: Occupational Therapy GoalsDescription: STGs (1 week)1. Pt will complete UB bathing and dressing with supervision2. Pt will complete toileting with min assist3. Pt will complete LB bathing and dressing with min assist4. Pt will complete grooming with set up Outcome: Outcome(s) achieved Problem: WoundGoal: Optimal Wound HealingOutcome: Outcome(s) achieved Melinda George was discharged via Ambulance accompanied by Spouse.  Verbalized understanding of discharge instructionsand recommended follow up care as per the after visit summary.  Written discharge instructions provided. Denies any further questions.  Pt and husband report they are leaving with all their belongings and have no questions at this time. Pt dc'd. Vital signs    Vitals:  11/10/19 0300 11/10/19 0819 11/10/19 0850 11/10/19 1500 BP: (!) 154/79 132/65  (!) 139/53 Pulse: (!) 93 90  84 Resp: (!) 22 20  20  Temp: 98.7 ?F (37.1 ?C) 97.6 ?F (36.4 ?C)  98.8 ?F (37.1 ?C) TempSrc: Temporal Temporal  Temporal SpO2: 95% (!) 90% 97% 96% Weight:     Height:

## 2019-11-10 NOTE — Plan of Care
Inpatient Occupational Therapy Progress Note Default Flowsheet Data (most recent)  IP Adult OT Eval/Treat - 11/10/19 1035    General Information  Subjective  That feels good.  That just changes your whole outlook.    General Observations  Pt seated in recliner   Precautions/Limitations  fall precautions    Upper Body Bathing  Independence/Assistance Level  Minimum assist   Upper Body Bathing Comments  assist for back    Lower Body Bathing  Independence/Assistance Level  Minimum assist    Grooming  Independence/Assistance Level  Supervision   Grooming Activities  brush teeth;comb hair;wash face;standing at sink     AM-PAC - Daily Activity IP Short Form  Help needed from another person putting on/taking off regular lower body clothing  3 - A Little   Help needed from another person for bathing (incl. washing, rinsing, drying)  2 - A Lot   Help needed from another person for toileting (incl. using toilet, bedpan, urinal)  3 - A Little   Help needed from another person putting on/taking off regular upper body clothing  3 - A Little   Help needed from another person taking care of personal grooming such as brushing teeth  3 - A Little   Help needed from another person eating meals  4 - None   AM-PAC Daily Activity Raw Score (Total of rows above)  18    Clinical Impression  Follow up Assessment  Pt completed UB bathing and dressing seated in recliner with min assist.  She completed LB bathing standing at RW min-mod assist.  WIth supervision, she completed grooming standing at sink with RW.  Pt is in positive spirits and looking forward to her d/c today.    Patient/Family Stated Goals  Patient/Family Stated Goal(s)  return to prior level of functioning;take care of self    OT Discharge Summary  Reason for Discharge (OT)  patient discharged from this facility    Plan of Care Overview/ Patient Status    Problem: Occupational Therapy GoalsGoal: Rehab Plan of Care Review, OTOutcome: Interventions implemented as appropriateGoal: Rehab Individuality and Mutuality, OTOutcome: Interventions implemented as appropriateGoal: Rehab Discharge Needs Assessment, OTOutcome: Interventions implemented as appropriateGoal: Occupational Therapy GoalsDescription: STGs (1 week)1. Pt will complete UB bathing and dressing with supervision2. Pt will complete toileting with min assist3. Pt will complete LB bathing and dressing with min assist4. Pt will complete grooming with set up Outcome: Interventions implemented as appropriate

## 2019-11-10 NOTE — Plan of Care
Adult Speech and Language PathologySwallow Treatment Session3/24/2021Patient Name:  Brandilynn Taormina BrengleMR#:  [id number] Date of Birth:  [birth year]Therapist:  Benedetto Coons SLP IP Adult General Information - 11/10/19 1023    General Information  Subjective  I'm getting discharged today   Medical Diagnosis  Acute GI Bleeding   General Observations  Patient was laying in bed. Her husband, Greggory Stallion, was at bedside.   Pertinent History of Current Problem  Post-intubation secondary to GI surgery   Language(s) spoken at home  English   Primary Concern  dysphagia post-intubation   Prior Level of Functioning  no dysphagia   SLP IP Adult Pain/Comfort - 11/10/19 1023    Pain/Comfort  Location #1 - PreTreatment Rating (Numbers Scale)  0/10 - no pain   SLP Swallow Treatment Objective - 11/10/19 1023    *Treatment Objective  Treatment Objective  Increase swallow function;Tolerate PO regimen   Treatment Objective Comments  Ensure patient is tolerating current diet and advance diet to regular if warranted.        Dysphagia Treatment - 11/10/19 1023    Dysphagia/Swallow Treatment  Dysphagia Treatment  Diet changes   Dysphagia Follow Up  Patient is tolerating current PO regimen without overt signs/symptoms of dysphagia   Dysphagia Follow Up Comments  Patient is tolerating current PO regimen. Assess if possible to upgrade to regular solids and thin liquids.   Dysphagia Recommendations  Diet upgrade;Continue with aspiration precautions;Encourage PO intake   Dysphagia Recommendations Comments  Patient was assessed with PO intake of a cracker without overt s/s of aspiration/penetration. Post-swallow trace residue was due visible in the oral cavity. Continue with aspiration precautions of sitting upright and small bites due to client's essential tremor.   Medication Administration  whole;with liquid   Dysphagia Plan of Care  Other Dysphagia Plan of Care Comments  Upgrade to regular diet with thin liquids. Patient was instructed to sit upright when eating/drinking and take small bites due to essential tremor. SLP signing off.         SLP IP Adult Recommendations - 11/10/19 1023    Recommendations  SLP Therapy Frequency  No follow up necessary   Recommendations discussed with:  nurse;patient;family    SLP IP Adult Discharge Summary - 11/10/19 1034    Discharge Summary  Reason for Discharge  patient met all goals and outcomes;no further needs identified   Disposition Recommendation(s)  No Speech and Language needs anticipated   Plan of Care Overview/ Patient Status: Upgrade to regular diet with thin liquids. Patient was instructed to sit upright when eating/drinking and take small bites due to essential tremor. SLP signing off.

## 2019-11-10 NOTE — Discharge Summary
Med/Surg Discharge SummaryPatient Data:  Patient Name: Melinda George Age: 69 y.o. DOB: 05-Nov-1950	 MRN: [id number]	 Admit date: 3/17/2021Discharge date: 11/10/2019 Discharge Attending Physician: Melton Alar, MDPCP: Kingsley Plan, MDPrincipal Diagnosis: Acute GI bleedingOther Diagnosis: Urinary tract infectionAcute hypoxic respiratory failureAlcohol withdrawalAcute kidney injuryLactic acidosisDischarged Condition: goodDisposition: Skilled Holiday representative for [employer]. Call made to SNF Provider by Care Team YesAllergies Allergies Allergen Reactions ? Lactose (Bulk) Rash ? Lanolin Rash ? Nickel Rash  Hospital Course: Melinda George is a 69 y.o. year old female with a PMH significant for alcohol abuse, hypertension, GERD, hyperlipidemia and obesity, who presented with vomiting.  She has been battling alcohol dependence for many years, and was drinking 1.5 liters of wine daily.  She woke up in the middle of the night with nausea and began retching, and then noted bright red blood per rectum.  She was admitted to the ICU for further management seen in consultation with Gastroenterology.For her hematemesis, she underwent emergent EGD on 03/17, which was consistent with gastric ulcers, diffuse gastritis but no active bleed.  No evidence of Mallory-Weiss tear either.  She was initially placed on octreotide, which was then discontinued.  Colonoscopy was done, which did show probable diverticular bleed.  She was placed on Protonix daily.  There was also concern for urinary tract infection and urine cultures consistent Klebsiella pneumonia.  She completed course of antibiotics with ceftriaxone and her blood cultures remained negative. She was also intubated from 03/17 to 3/21 for airway protection.  She was extubated and then weaned down to room air prior to discharge and was saturating well.  There was also question of alcohol withdrawal, and she was monitored on CIWA.  Prior to discharge, she was not actively withdrawing anymore.  Her AKI and lactic acidosis resolved with supportive management.  She is now being discharged to SNF for short-term rehab, will continue to follow up with her PCP for further management.Pertinent lab findings and test results: Esophagogastroduodenoscopy with biopsy1. Normal appearing small bowel D1 to D3/D4 no evidence of coffee-grounds no AVMs no evidence of source of GI bleeding. ?Major papilla was identified no evidence of blood2. Normal pylorus3. Nonbleeding crater gastric ulcers with no stigmata or bleeding were found in the gastric antrum and pre-pyloric region of the stomach. ?The largest was ?5 millimeters in diameter ER but deeply crater did biopsies were taken there was no evidence of hematin spot visible vessel or recent bleeding. ?There were other ulcers in the antrum and pre-pyloric region ranging from 2 to 5 millimeters but not as significantly crated. ?For for documentation of the larger ulcer was noted. ?Biopsies were taken4.?There were also gastric body ulcers with erosions and some heme but no evidence of significant source of bleeding.5.?Diffuse gastritis is noted with congestion erythema biopsy the gastric body were taken6.?There was heme in the form of coffee grounds in the fundus region of the gastric lumen but again no evidence of source of this blood.7.?Excellent visit is a shin of the GE junction on the gastric side without evidence of Mallory-Weiss tear 8.?Squamocolumnar junction was located at 38 centimeters with distal esophageal ulceration 0 to 1+ esophageal varices without evidence of source of GI bleeding. ?The varices were really inconsequential photo documentation is obtained. ?There was no hematin spots no cherry-red spot no source of bleeding could be seen in the esophagus the rest of the esophageal mucosa was normal biopsies were takenDischarge vitals: Blood pressure 132/65, pulse 90, temperature 97.6 ?F (36.4 ?C), temperature source Temporal,  resp. rate 20, height 5' 6 (1.676 m), weight 104.4 kg, SpO2 97 %.Discharge Physical Exam:Physical Exam Constitutional: She is oriented to person, place, and time. She appears well-developed and well-nourished. No distress. HENT: Head: Normocephalic and atraumatic. Neck: Normal range of motion. Neck supple. Cardiovascular: Normal rate, regular rhythm and normal heart sounds. Exam reveals no gallop and no friction rub. No murmur heard.Pulmonary/Chest: Effort normal and breath sounds normal. No respiratory distress. She has no wheezes. She has no rales. Abdominal: Soft. Bowel sounds are normal. She exhibits no distension. There is no abdominal tenderness. There is no rebound and no guarding. Musculoskeletal: Normal range of motion. Neurological: She is alert and oriented to person, place, and time. Skin: Skin is warm and dry. She is not diaphoretic.  Pending Labs and Tests: Pending Lab Results   Order Current Status  COVID-19 Clearance or Disposition In process  ISSUES TO BE ADDRESSED POST DISCHARGE: 1. Gastritis/gastric ulcers2. Alcohol abstinenceDischarge Medications: Current Discharge Medication List  START taking these medications  Details ferrous gluconate (FERGON) 324 mg (38 mg iron) tablet Take 1 tablet (324 mg total) by mouth daily with breakfast.Start date: 11/11/2019 pantoprazole (PROTONIX) 40 mg tablet Take 1 tablet (40 mg total) by mouth daily.Qty:  Start date: 11/10/2019   CONTINUE these medications which have NOT CHANGED  Details escitalopram oxalate (LEXAPRO) 20 MG tablet TAKE 1 TABLET BY MOUTH EVERY DAYQty: 90 tablet, Refills: 1  gabapentin (NEURONTIN) 300 MG capsule Take 300 mg by mouth 2 (two) times daily with breakfast and dinner. TAKE 1 OR 2 CAPSULES AT BEDTIMERefills: 0  naltrexone (REVIA) 50 mg tablet Take 50 mg by mouth 2 (two) times daily.  BYSTOLIC 20 mg tablet TAKE 1 TABLET BY MOUTH EVERY DAYQty: 30 tablet, Refills: 6Start date: 11/04/2019  lactobacillus rhamnosus, GG, (CULTURELLE) 10 billion cell capsule Take 1 capsule by mouth daily.   .Follow-up Information:[facility] [phone] [facility] [place], Jacquelynn Cree, MD[address] [facility] [place] [phone] 2 weeksConnors, Megan Mans, [address] [place] [phone] 4 weeks PMH PSH Past Medical History: Diagnosis Date ? Alcohol abuse, in remission 12/15/2011 ? Alcohol dependence (HC Code)  ? Body mass index 39.0-39.9, adult 06/02/2013 ? Depressive disorder 12/21/2009 ? Essential hypertension 12/21/2009  CONT CURRENT MEDS, CR AUG 2014 0.9 ? Essential tremor 08/22/2010 ? Gastroesophageal reflux disease 12/21/2009 ? Generalized anxiety disorder 12/21/2009 ? Hyperlipidemia 12/21/2009 ? Obesity 05/28/2013 ? Rosacea 12/21/2009 ? Sprain of ankle 04/30/2010  Past Surgical History: Procedure Laterality Date ? COLONOSCOPY  12/12/2010  DONE AT [facility] RECALL IN 5 YEARS ? COLONOSCOPY W/ OR W/O BIOPSY N/A 11/04/2019 Procedure: COLONOSCOPY, FLEXIBLE; W/BX, SINGLE/MULTIPLE;  Surgeon: Phylliss Bob, MD;  Location: [facility] ENDOSCOPY;  Service: Gastroenterology;  Laterality: N/A; ? HYSTERECTOMY   ? LUMBAR DISC SURGERY  08/19/1992  LUMBAR ? TOTAL ABDOMINAL HYSTERECTOMY W/ BILATERAL SALPINGOOPHORECTOMY  08/20/1995 ? TOTAL ELBOW REPLACEMENT  05/2014  [email or web address]  Social History Family History Social History Tobacco Use ? Smoking status: Former Smoker   Packs/day: 1.00   Years: 10.00   Pack years: 10.00   Types: Cigarettes ? Smokeless tobacco: Never Used Substance Use Topics ? Alcohol use: Yes   Alcohol/week: 84.0 standard drinks   Types: 84 Glasses of wine per week   Frequency: 4 or more times a week   Drinks per session: 3 or 4   Binge frequency: Daily or almost daily   Comment: Patient drinks 3 bottles of wine daily, sometimes more  Family History  Problem Relation Age of Onset ? Alcohol abuse Other       self ? ADHD Other  ? Depression Other  ? Endometriosis Other       self ? Heart disease Other       grandmother ? Hypertension Other       grandmother ? Osteoporosis Other  ? Stroke Other  ? Thyroid disease Other  ? Preeclampsia Other  ? Breast cancer Maternal Aunt  ? ADHD Brother  ? Heart disease Father  ? Osteoporosis Maternal Grandmother  ? Stroke Maternal Grandmother  ? Thyroid disease Sister  ? Preeclampsia Sister  ? Breast cancer Paternal Aunt   Encounter Time:  > 30 minutesElectronically Signed:[facility] [place] Welton Flakes, MD3/24/2021 10:16 AMBest Contact Information: [phone] report was created in part from templates and voice recognition software. Typographical and minor dictation errors may be present.
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
## 2019-11-11 ENCOUNTER — Other Ambulatory Visit: Admit: 2019-11-11 | Payer: PRIVATE HEALTH INSURANCE

## 2019-11-11 DIAGNOSIS — K922 Gastrointestinal hemorrhage, unspecified: Secondary | ICD-10-CM

## 2019-11-11 DIAGNOSIS — F1020 Alcohol dependence, uncomplicated: Secondary | ICD-10-CM

## 2019-11-11 LAB — COMPREHENSIVE METABOLIC PANEL
BKR ALANINE AMINOTRANSFERASE (ALT): 25 U/L (ref 13–56)
BKR ALBUMIN: 2.7 g/dL — ABNORMAL LOW (ref 3.4–5.0)
BKR ALKALINE PHOSPHATASE: 69 U/L (ref 45–117)
BKR ANION GAP (LM): 6 mmol/L (ref 5–15)
BKR ASPARTATE AMINOTRANSFERASE (AST): 18 U/L (ref 15–37)
BKR BILIRUBIN TOTAL: 0.4 mg/dL (ref <1.0)
BKR BLOOD UREA NITROGEN: 7 mg/dL (ref 7–18)
BKR CALCIUM: 8.6 mg/dL (ref 8.5–10.1)
BKR CHLORIDE: 107 mmol/L — ABNORMAL HIGH (ref 98–107)
BKR CO2: 26 mmol/L (ref 21–32)
BKR CREATININE: 0.76 mg/dL (ref 0.55–1.02)
BKR EGFR (AFR AMER) (LMC): >60 mL/min/{1.73_m2} (ref >60)
BKR EGFR (NON AFR AMER) (LMC): >60 mL/min/{1.73_m2} (ref >60)
BKR GLOBULIN: 3.1 g/dL (ref 2.5–5.0)
BKR GLUCOSE: 98 mg/dL (ref 65–110)
BKR POTASSIUM: 3.2 mmol/L — ABNORMAL LOW (ref 3.5–5.1)
BKR PROTEIN TOTAL: 5.8 g/dL — ABNORMAL LOW (ref 6.4–8.2)
BKR SODIUM: 139 mmol/L (ref 136–145)

## 2019-11-11 LAB — MANUAL DIFFERENTIAL
BKR WAM BASOPHIL - ABS (DIFF) 2 DEC: 0.22 x 1000/ÂµL (ref 0.00–0.30)
BKR WAM BASOPHILS (DIFF): 3 % (ref 0.0–3.0)
BKR WAM EOSINOPHILS (DIFF) 2 DEC: 0.29 x 1000/ÂµL (ref 0.00–0.45)
BKR WAM EOSINOPHILS (DIFF): 4 % (ref 0.0–4.0)
BKR WAM LYMPHOCYTE - ABS (DIFF) 2 DEC: 1.36 x 1000/ÂµL (ref 1.00–4.50)
BKR WAM LYMPHOCYTES (DIFF): 19 % — ABNORMAL LOW (ref 25.0–45.0)
BKR WAM MONOCYTE - ABS (DIFF) 2 DEC: 1.08 x 1000/ÂµL (ref 0.00–1.20)
BKR WAM MONOCYTES (DIFF): 15 % — ABNORMAL HIGH (ref 0.0–12.0)
BKR WAM NEUTROPHILS (DIFF): 60 % (ref 36.0–66.0)
BKR WAM NEUTROPHILS - ABS (DIFF) 2 DEC: 4.31 x 1000/ÂµL (ref 1.40–6.60)
BKR WAM PLATELET ESTIMATE: NORMAL

## 2019-11-11 LAB — CBC WITHOUT DIFFERENTIAL
BKR WAM HEMATOCRIT: 29.3 % — ABNORMAL LOW (ref 34.0–45.0)
BKR WAM HEMOGLOBIN: 9.4 g/dL — ABNORMAL LOW (ref 11.0–15.0)
BKR WAM MCH PG: 30.1 pg (ref 27–33)
BKR WAM MCHC: 32.1 g/dL (ref 32.0–36.0)
BKR WAM MCV: 93.9 fL (ref 79.0–99.0)
BKR WAM MPV: 9.5 fL (ref 7.5–11.5)
BKR WAM PLATELETS: 264 x1000/ÂµL (ref 140–400)
BKR WAM RDW-CV: 13.1 % (ref 11.5–14.5)
BKR WAM RED BLOOD CELL COUNT.: 3.12 M/ÂµL — ABNORMAL LOW (ref 4.00–5.20)
BKR WAM WHITE BLOOD CELL COUNT.: 7.18 x1000/ÂµL (ref 4.00–10.00)

## 2019-11-11 LAB — AMMONIA     (BH GH L LMW YH): BKR AMMONIA: 11 umol/L (ref <32)

## 2019-11-15 ENCOUNTER — Other Ambulatory Visit: Admit: 2019-11-15 | Payer: PRIVATE HEALTH INSURANCE | Attending: Internal Medicine

## 2019-11-15 DIAGNOSIS — U071 COVID-19: Secondary | ICD-10-CM

## 2019-11-15 DIAGNOSIS — Z20822 Contact with and (suspected) exposure to covid-19: Secondary | ICD-10-CM

## 2019-11-16 ENCOUNTER — Other Ambulatory Visit: Admit: 2019-11-16 | Payer: PRIVATE HEALTH INSURANCE

## 2019-11-16 DIAGNOSIS — N179 Acute kidney failure, unspecified: Secondary | ICD-10-CM

## 2019-11-16 DIAGNOSIS — K922 Gastrointestinal hemorrhage, unspecified: Secondary | ICD-10-CM

## 2019-11-16 LAB — CBC WITH AUTO DIFFERENTIAL
BKR WAM ABSOLUTE IMMATURE GRANULOCYTES.: 0.02 x 1000/ÂµL (ref 0.00–0.10)
BKR WAM ABSOLUTE LYMPHOCYTE COUNT.: 1.6 x 1000/ÂµL (ref 1.00–4.50)
BKR WAM ABSOLUTE NEUTROPHIL COUNT.: 4.96 x 1000/ÂµL (ref 1.40–6.60)
BKR WAM BASOPHIL ABSOLUTE COUNT.: 0.12 x 1000/ÂµL (ref 0.0–0.3)
BKR WAM BASOPHILS: 1.5 % (ref 0.0–3.0)
BKR WAM EOSINOPHIL ABSOLUTE COUNT.: 0.41 x 1000/ÂµL (ref 0.00–0.45)
BKR WAM EOSINOPHILS: 5.2 % — ABNORMAL HIGH (ref 0.0–4.0)
BKR WAM HEMATOCRIT: 33.7 % — ABNORMAL LOW (ref 34.0–45.0)
BKR WAM HEMOGLOBIN: 10.5 g/dL — ABNORMAL LOW (ref 11.0–15.0)
BKR WAM IMMATURE GRANULOCYTES: 0.3 % (ref 0.0–1.0)
BKR WAM LYMPHOCYTES: 20.2 % — ABNORMAL LOW (ref 25.0–45.0)
BKR WAM MCH PG: 29.9 pg (ref 27–33)
BKR WAM MCHC: 31.2 g/dL — ABNORMAL LOW (ref 32.0–36.0)
BKR WAM MCV: 96 fL (ref 79.0–99.0)
BKR WAM MONOCYTE ABSOLUTE COUNT.: 0.83 x 1000/ÂµL (ref 0.00–1.20)
BKR WAM MONOCYTES: 10.5 % (ref 0.0–12.0)
BKR WAM MPV: 9.6 fL (ref 7.5–11.5)
BKR WAM NEUTROPHILS: 62.3 % (ref 36.0–66.0)
BKR WAM NUCLEATED RED BLOOD CELLS: 0 % (ref 0.0–5.0)
BKR WAM PLATELETS: 416 x1000/ÂµL — ABNORMAL HIGH (ref 140–400)
BKR WAM RDW-CV: 13.3 % (ref 11.5–14.5)
BKR WAM RED BLOOD CELL COUNT.: 3.51 M/ÂµL — ABNORMAL LOW (ref 4.00–5.20)
BKR WAM WHITE BLOOD CELL COUNT.: 7.94 x1000/ÂµL (ref 4.00–10.00)

## 2019-11-16 LAB — BASIC METABOLIC PANEL
BKR ANION GAP (LM): 7 mmol/L (ref 5–15)
BKR BLOOD UREA NITROGEN: 5 mg/dL — ABNORMAL LOW (ref 7–18)
BKR CALCIUM: 8.6 mg/dL (ref 8.5–10.1)
BKR CHLORIDE: 110 mmol/L — ABNORMAL HIGH (ref 98–107)
BKR CO2: 24 mmol/L (ref 21–32)
BKR CREATININE: 0.99 mg/dL — ABNORMAL LOW (ref 0.55–1.02)
BKR EGFR (AFR AMER) (LMC): >60 mL/min/{1.73_m2} (ref >60)
BKR EGFR (NON AFR AMER) (LMC): 56 mL/min/{1.73_m2} (ref >60)
BKR GLUCOSE: 96 mg/dL (ref 65–110)
BKR POTASSIUM: 4 mmol/L (ref 3.5–5.1)
BKR SODIUM: 141 mmol/L (ref 136–145)

## 2019-11-16 LAB — SARS COV-2 (COVID-19) RNA: BKR SARS-COV-2 RNA (COVID-19) (YH): NEGATIVE

## 2019-11-18 ENCOUNTER — Ambulatory Visit: Admit: 2019-11-18 | Payer: PRIVATE HEALTH INSURANCE

## 2019-11-18 DIAGNOSIS — Z23 Encounter for immunization: Secondary | ICD-10-CM

## 2019-11-24 ENCOUNTER — Encounter: Admit: 2019-11-24 | Payer: PRIVATE HEALTH INSURANCE | Attending: Anesthesiology

## 2019-11-24 DIAGNOSIS — S93409A Sprain of unspecified ligament of unspecified ankle, initial encounter: Secondary | ICD-10-CM

## 2019-11-24 DIAGNOSIS — F1020 Alcohol dependence, uncomplicated: Secondary | ICD-10-CM

## 2019-11-24 DIAGNOSIS — I10 Essential (primary) hypertension: Secondary | ICD-10-CM

## 2019-11-24 DIAGNOSIS — Z6839 Body mass index (BMI) 39.0-39.9, adult: Secondary | ICD-10-CM

## 2019-11-24 DIAGNOSIS — L719 Rosacea, unspecified: Secondary | ICD-10-CM

## 2019-11-24 DIAGNOSIS — F1011 Alcohol abuse, in remission: Secondary | ICD-10-CM

## 2019-11-24 DIAGNOSIS — F32A Depressive disorder: Secondary | ICD-10-CM

## 2019-11-24 DIAGNOSIS — E669 Obesity, unspecified: Secondary | ICD-10-CM

## 2019-11-24 DIAGNOSIS — E785 Hyperlipidemia, unspecified: Secondary | ICD-10-CM

## 2019-11-24 DIAGNOSIS — K219 Gastro-esophageal reflux disease without esophagitis: Secondary | ICD-10-CM

## 2019-11-24 DIAGNOSIS — F411 Generalized anxiety disorder: Secondary | ICD-10-CM

## 2019-11-24 DIAGNOSIS — G250 Essential tremor: Secondary | ICD-10-CM

## 2019-11-24 NOTE — Anesthesia Pre-Procedure Evaluation
This is a 69 y.o. female scheduled for COLONOSCOPY, FLEXIBLE; W/BX, SINGLE/MULTIPLE (N/A ).Review of Systems/ Medical HistoryPatient summary, nursing notes, pre-procedure vitals, height, weight and NPO status reviewed.No previous anesthesia concernsAnesthesia Evaluation:   No history of anesthetic complications  Estimated body mass index is 37.15 kg/m? as calculated from the following:  Height as of this encounter: 5' 6 (1.676 m).  Weight as of this encounter: 104.4 kg. CC/HPI: Past Medical History04/28/2013: Alcohol abuse, in remissionNo date: Alcohol dependence (HC Code)06/02/2013: Body mass index 39.0-39.9, adult05/12/2009: Depressive disorder05/12/2009: Essential hypertension    Comment:  CONT CURRENT MEDS, CR AUG 2014 0.901/11/2010: Essential tremor05/12/2009: Gastroesophageal reflux disease05/12/2009: Generalized anxiety disorder05/12/2009: Hyperlipidemia10/05/2013: Obesity05/12/2009: Rosacea09/07/2010: Sprain of anklePast Surgical History:  Past Surgical History:12/12/2010: COLONOSCOPY    Comment:  DONE AT [facility] Daphene Calamity RECALL              IN 5 YEARSNo date: HYSTERECTOMY01/08/1992: LUMBAR DISC SURGERY    Comment:  LUMBAR01/08/1995: TOTAL ABDOMINAL HYSTERECTOMY W/ BILATERAL SALPINGOOPHORECTOMY10/2015: TOTAL ELBOW REPLACEMENT    Comment:  [email or web address] has a history of: hypertension. Gastrointestinal/Genitourinary: -Gastrointestinal Disorders:  Patient has GERD.-Nutritional Disorders: Patient has has increased body weightBehavioral/Psychiatric & Syndromes:  Patient has psychiatric history and depression.Additional Findings: 11/03/19- acute GIB, intubationPhysical ExamCardiovascular:  normal exam  Rhythm: regularPulmonary:  normal exam  Airway:  Mallampati: IITM distance: >3 FBNeck ROM: fullDental:  normal exam  Anesthesia PlanASA 2 The primary anesthesia plan is  MAC. Anesthesia informed consent obtained. Anesthesia written consent obtainedConsent obtained from: patientThe post operative pain plan is per surgeon management.Anesthesiologist/CRNA's Pre Op NoteI personally evaluated and examined the patient prior to the intra-operative phase of care.

## 2019-11-26 MED ORDER — FERROUS GLUCONATE 324 MG (38 MG IRON) TABLET
324 | ORAL_TABLET | ORAL | 1 refills | 90.00000 days | Status: AC
Start: 2019-11-26 — End: 2019-12-20

## 2019-12-08 MED ORDER — GABAPENTIN 300 MG CAPSULE
300 | ORAL_CAPSULE | Freq: Two times a day (BID) | ORAL | 1 refills | 30.00000 days | Status: AC
Start: 2019-12-08 — End: ?

## 2019-12-20 MED ORDER — FERROUS GLUCONATE 324 MG (38 MG IRON) TABLET
324 | ORAL_TABLET | INTRAMUSCULAR | 1 refills | 90.00000 days | Status: AC
Start: 2019-12-20 — End: 2020-01-05

## 2020-01-03 ENCOUNTER — Encounter: Admit: 2020-01-03 | Payer: PRIVATE HEALTH INSURANCE

## 2020-01-03 DIAGNOSIS — Z1231 Encounter for screening mammogram for malignant neoplasm of breast: Secondary | ICD-10-CM

## 2020-01-05 MED ORDER — FERROUS GLUCONATE 324 MG (38 MG IRON) TABLET
324 | ORAL_TABLET | ORAL | 1 refills | 90.00000 days | Status: AC
Start: 2020-01-05 — End: ?

## 2020-01-07 ENCOUNTER — Inpatient Hospital Stay: Admit: 2020-01-07 | Discharge: 2020-01-07 | Payer: MEDICARE

## 2020-01-07 ENCOUNTER — Encounter: Admit: 2020-01-07 | Payer: PRIVATE HEALTH INSURANCE

## 2020-01-07 DIAGNOSIS — I10 Essential (primary) hypertension: Secondary | ICD-10-CM

## 2020-01-07 DIAGNOSIS — E785 Hyperlipidemia, unspecified: Secondary | ICD-10-CM

## 2020-01-07 DIAGNOSIS — F411 Generalized anxiety disorder: Secondary | ICD-10-CM

## 2020-01-07 DIAGNOSIS — K219 Gastro-esophageal reflux disease without esophagitis: Secondary | ICD-10-CM

## 2020-01-07 DIAGNOSIS — F1011 Alcohol abuse, in remission: Secondary | ICD-10-CM

## 2020-01-07 DIAGNOSIS — G250 Essential tremor: Secondary | ICD-10-CM

## 2020-01-07 DIAGNOSIS — E669 Obesity, unspecified: Secondary | ICD-10-CM

## 2020-01-07 DIAGNOSIS — F32A Depressive disorder: Secondary | ICD-10-CM

## 2020-01-07 DIAGNOSIS — Z1231 Encounter for screening mammogram for malignant neoplasm of breast: Secondary | ICD-10-CM

## 2020-01-07 DIAGNOSIS — L719 Rosacea, unspecified: Secondary | ICD-10-CM

## 2020-01-07 DIAGNOSIS — F1020 Alcohol dependence, uncomplicated: Secondary | ICD-10-CM

## 2020-01-07 DIAGNOSIS — Z6839 Body mass index (BMI) 39.0-39.9, adult: Secondary | ICD-10-CM

## 2020-01-07 DIAGNOSIS — S93409A Sprain of unspecified ligament of unspecified ankle, initial encounter: Secondary | ICD-10-CM

## 2020-01-24 NOTE — Other
Normal mammogram study.  Follow-up testing recommended in 1 year

## 2020-01-28 ENCOUNTER — Inpatient Hospital Stay: Admit: 2020-01-28 | Discharge: 2020-01-28 | Payer: MEDICARE

## 2020-01-28 ENCOUNTER — Encounter: Admit: 2020-01-28 | Payer: PRIVATE HEALTH INSURANCE | Attending: Psychiatry

## 2020-01-28 DIAGNOSIS — Z130 Encounter for screening for diseases of the blood and blood-forming organs and certain disorders involving the immune mechanism: Secondary | ICD-10-CM

## 2020-01-28 DIAGNOSIS — F411 Generalized anxiety disorder: Secondary | ICD-10-CM

## 2020-01-28 LAB — CBC WITH AUTO DIFFERENTIAL
BKR WAM ABSOLUTE IMMATURE GRANULOCYTES.: 0.03 x 1000/ÂµL (ref 0.00–0.10)
BKR WAM ABSOLUTE LYMPHOCYTE COUNT.: 1.38 x 1000/ÂµL (ref 1.00–4.50)
BKR WAM ABSOLUTE NEUTROPHIL COUNT.: 6.78 x 1000/ÂµL — ABNORMAL HIGH (ref 1.40–6.60)
BKR WAM BASOPHIL ABSOLUTE COUNT.: 0.08 x 1000/ÂµL (ref 0.0–0.3)
BKR WAM BASOPHILS: 0.9 % (ref 0.0–3.0)
BKR WAM EOSINOPHIL ABSOLUTE COUNT.: 0.31 x 1000/ÂµL (ref 0.00–0.45)
BKR WAM EOSINOPHILS: 3.4 % (ref 0.0–4.0)
BKR WAM HEMATOCRIT: 37.4 % (ref 34.0–45.0)
BKR WAM HEMOGLOBIN: 11.6 g/dL (ref 11.0–15.0)
BKR WAM IMMATURE GRANULOCYTES: 0.3 % (ref 0.0–1.0)
BKR WAM LYMPHOCYTES: 15.1 % — ABNORMAL LOW (ref 25.0–45.0)
BKR WAM MCH PG: 27.6 pg (ref 27–33)
BKR WAM MCHC: 31 g/dL — ABNORMAL LOW (ref 32.0–36.0)
BKR WAM MCV: 89 fL (ref 79.0–99.0)
BKR WAM MONOCYTE ABSOLUTE COUNT.: 0.55 x 1000/ÂµL (ref 0.00–1.20)
BKR WAM MONOCYTES: 6 % (ref 0.0–12.0)
BKR WAM MPV: 10
BKR WAM NEUTROPHILS: 74.3 % — ABNORMAL HIGH (ref 36.0–66.0)
BKR WAM NUCLEATED RED BLOOD CELLS: 0 % (ref 0.0–5.0)
BKR WAM PLATELETS: 344 x1000/ÂµL (ref 140–400)
BKR WAM RDW-CV: 14.5 % (ref 11.5–14.5)
BKR WAM RED BLOOD CELL COUNT.: 4.2 M/ÂµL (ref 4.00–5.20)
BKR WAM WHITE BLOOD CELL COUNT.: 9.13 x1000/ÂµL (ref 4.00–10.00)

## 2020-01-28 LAB — COMPREHENSIVE METABOLIC PANEL
BKR ALANINE AMINOTRANSFERASE (ALT): 20 U/L (ref 13–56)
BKR ALBUMIN: 3.4 g/dL (ref 3.4–5.0)
BKR ALKALINE PHOSPHATASE: 80 U/L (ref 45–117)
BKR ANION GAP (LM): 6 mmol/L (ref 5–15)
BKR ASPARTATE AMINOTRANSFERASE (AST): 14 U/L — ABNORMAL LOW (ref 15–37)
BKR BILIRUBIN TOTAL: 0.4 mg/dL (ref <1.0)
BKR BLOOD UREA NITROGEN: 11 mg/dL (ref 7–18)
BKR CALCIUM: 9.3 mg/dL (ref 8.5–10.1)
BKR CHLORIDE: 108 mmol/L — ABNORMAL HIGH (ref 98–107)
BKR CO2: 28 mmol/L (ref 21–32)
BKR CREATININE: 1 mg/dL (ref 0.55–1.02)
BKR EGFR (AFR AMER) (LMC): >60 mL/min/{1.73_m2} (ref >60)
BKR EGFR (NON AFR AMER) (LMC): 55 mL/min/{1.73_m2} (ref >60)
BKR GLOBULIN: 3.7 g/dL (ref 2.5–5.0)
BKR GLUCOSE: 122 mg/dL — ABNORMAL HIGH (ref 65–110)
BKR POTASSIUM: 4.6 mmol/L (ref 3.5–5.1)
BKR PROTEIN TOTAL: 7.1 g/dL (ref 6.4–8.2)
BKR SODIUM: 142 mmol/L (ref 136–145)

## 2020-01-31 NOTE — Other
Lab test results are all essentially normal

## 2020-06-09 IMAGING — MR MRI LUMBAR SPINE WITHOUT CONTRAST
4 of 6 series · 23 of 48 positions shown · IV contrast (gadolinium)
Comparison: None

MRI LUMBAR SPINE WITHOUT CONTRAST, 06/09/2020 [DATE]: 
CLINICAL INDICATION: Malignant glioma laminectomy, radiculopathy. Film which 
8281, pain extending to right hip and buttocks
TECHNIQUE: Sagittal T1, Sagittal T2, Sagittal STIR, Axial T1 and Axial T2 MR 
images of the lumbar spine were performed without intravenous gadolinium 
enhancement.

[Series 101: survey · axial · 10.0mm · 1.39mm/px · z∈[+15,+199]mm · 3 of 14 slices shown]
[im 3/14]
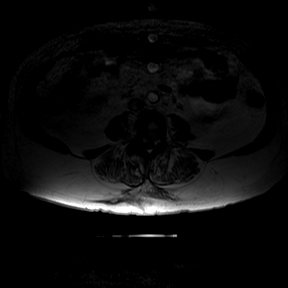
[im 8/14]
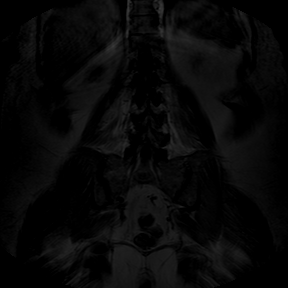
[im 14/14]
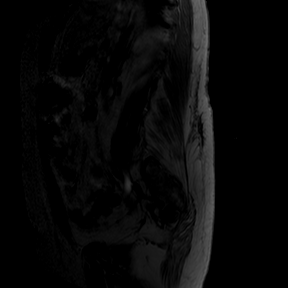

[Series 301: t1w tse sag · sagittal · 4.0mm · 0.50mm/px · 3 of 17 slices shown]
[im 4/17]
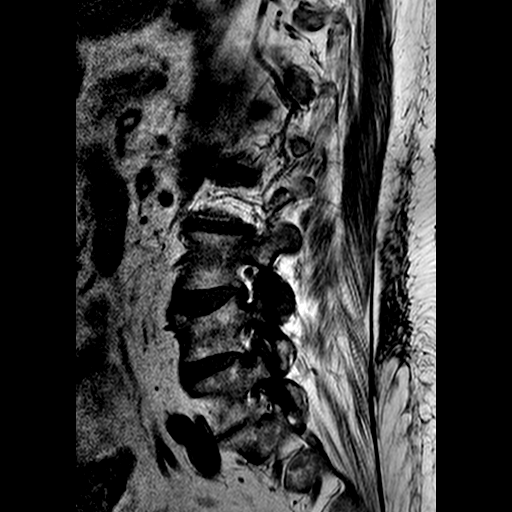
[im 10/17]
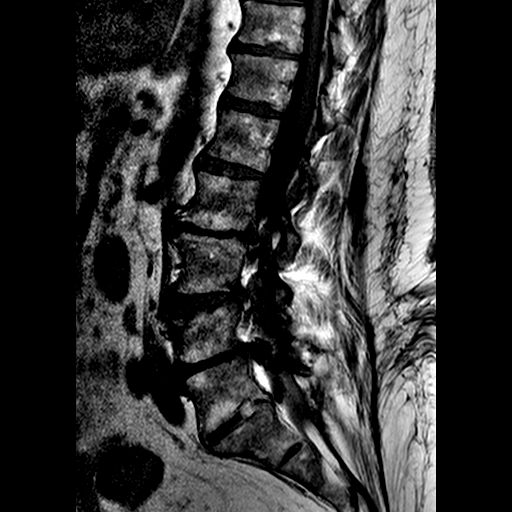
[im 17/17]
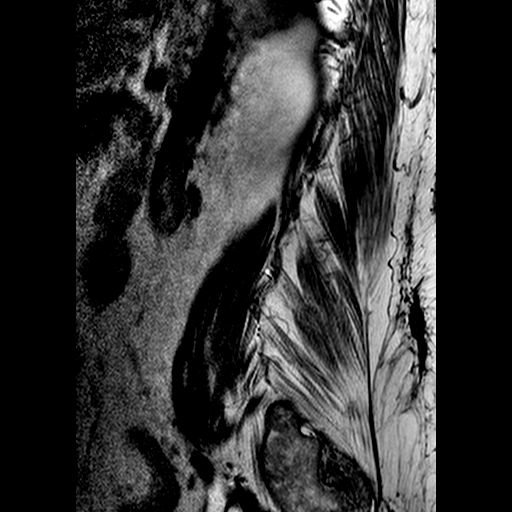

[Series 601: T2 · axial · 4.0mm · 0.47mm/px · z∈[-60,+153]mm · 11 of 30 slices shown]
[im 1/30]
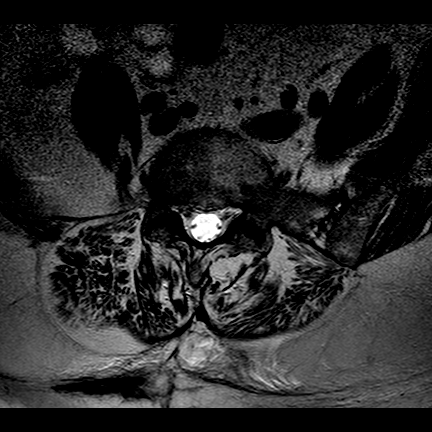
[im 3/30]
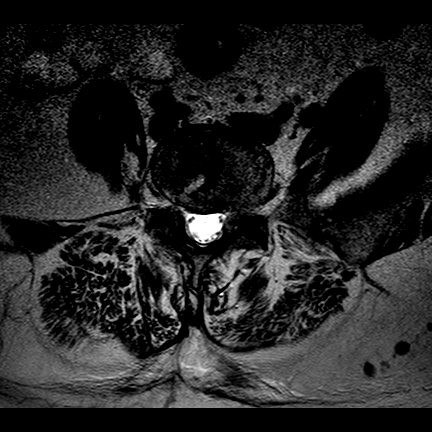
[im 6/30]
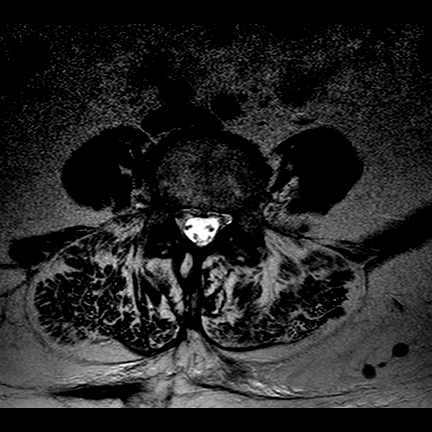
[im 9/30]
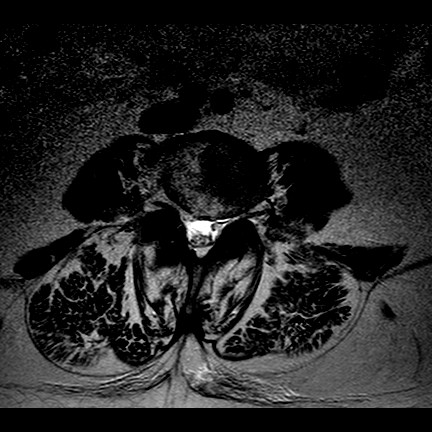
[im 12/30]
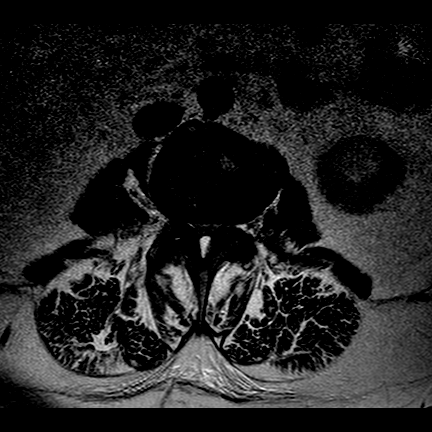
[im 15/30]
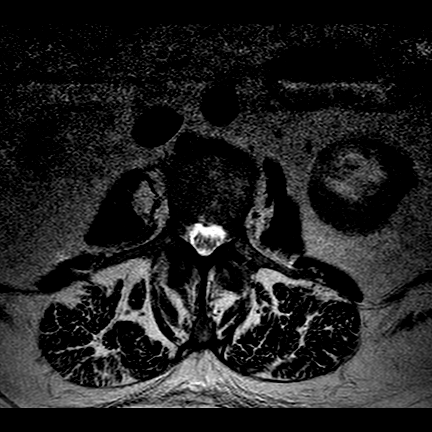
[im 18/30]
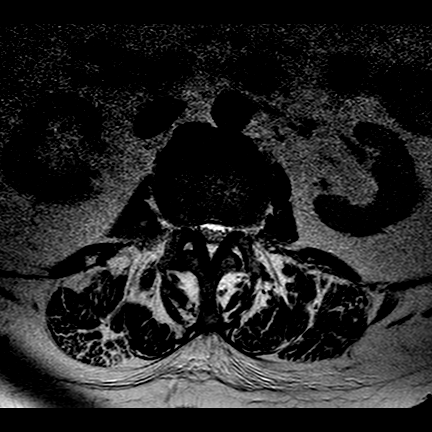
[im 21/30]
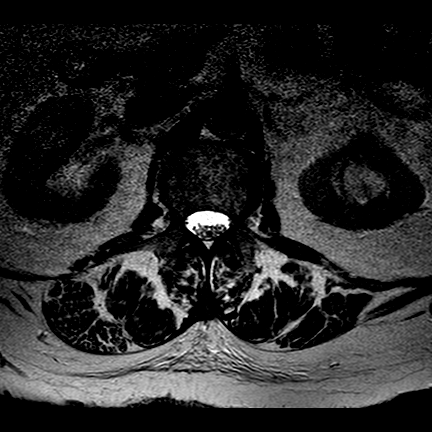
[im 24/30]
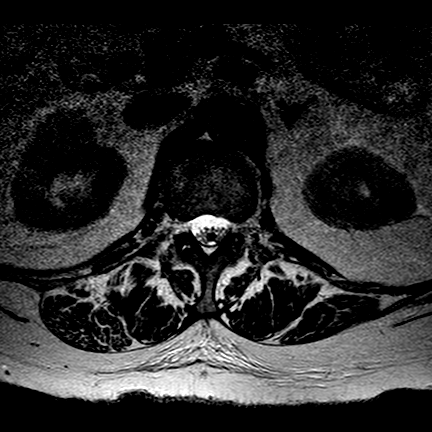
[im 27/30]
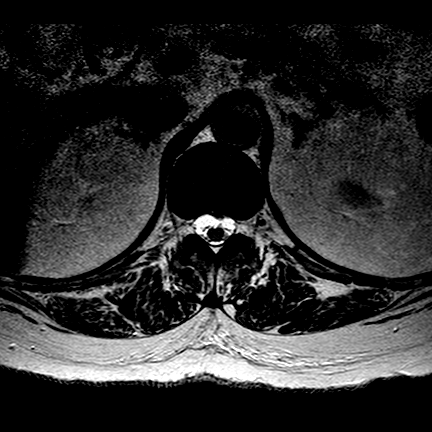
[im 30/30]
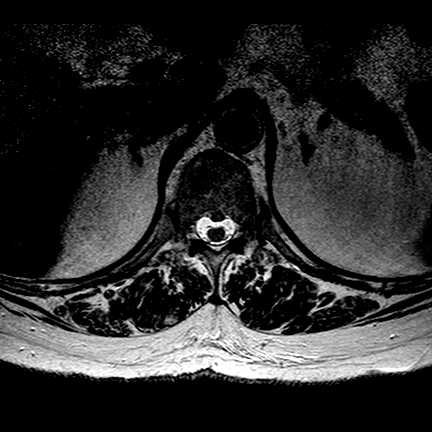

[Series 701: T1 · axial · 4.0mm · 0.35mm/px · z∈[-59,+93]mm · 6 of 35 slices shown]
[im 1/35]
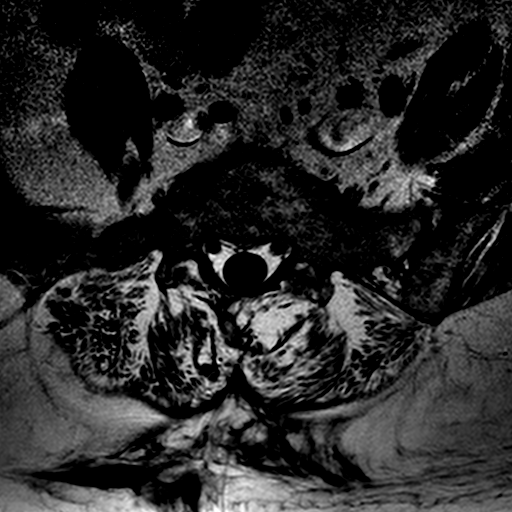
[im 6/35]
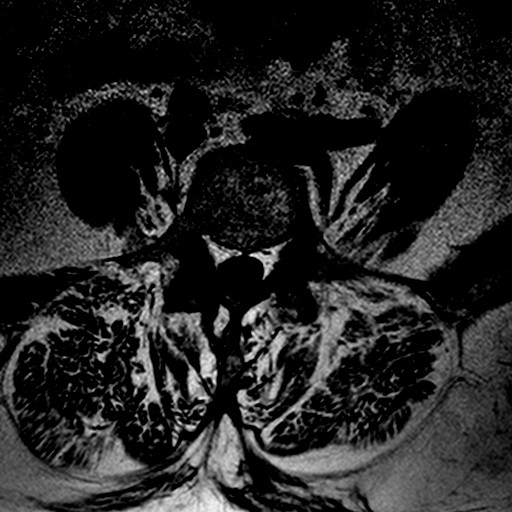
[im 12/35]
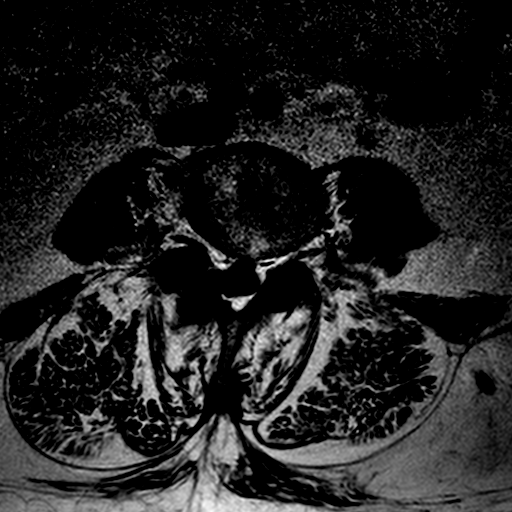
[im 15/35]
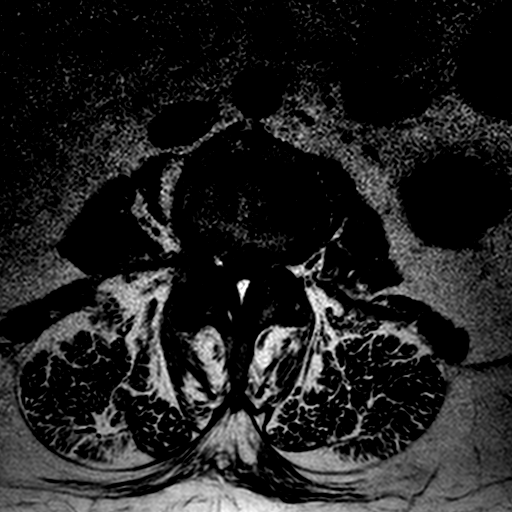
[im 18/35]
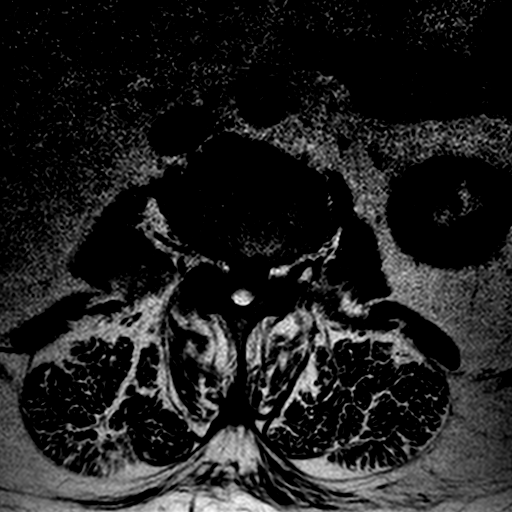
[im 29/35]
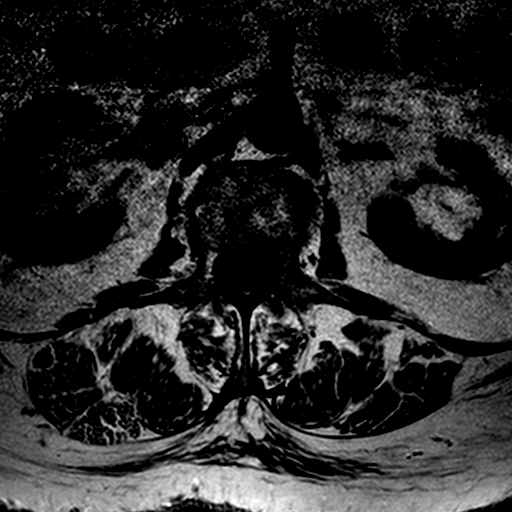

[23 of 48 positions shown; findings below may reference images not displayed]

FINDINGS: There is mild loss of height along the superior L4 endplate with 
Schmorl's node, appearing chronic. Other lumbar vertebral heights are intact. 
There are degenerative changes, with marked disc narrowing at L4-5, L5-S1 and 
L2-3. There is no significant listhesis. There is mild to moderate lumbar 
levoscoliosis. On coronal localizer views there is signal change in the superior 
femoral heads bilaterally. Avascular necrosis cannot be excluded. There is no 
evidence for fracture or spinal malignancy. The conus is normal.  Visualized 
sacrum is intact. Sagittal STIR images show reactive edema along the right L4-L5 
interspaces. 
At L5-S1 the canal is open. There is no significant foraminal stenosis. 
L4-5 there is posterior midline disc extrusion mildly effacing the ventral 
thecal sac and contributing to mild canal stenosis. There is moderate facet 
degeneration with left-sided ligamentous thickening. There appears to have been 
right laminotomy with ligament resection at this level. There is mild bilateral 
foraminal stenosis. 
At L3-4 there is marked canal stenosis due to broad-based disc bulge and facet 
and ligamentous hypertrophy. There is dorsal epidural lipomatosis. There is mild 
right foraminal narrowing. 
At L2-3 there is mild to moderate canal stenosis. Foramina are open. 
At L1-2 the canal and foramina are open.
IMPRESSION: Marked canal stenosis at L3-4. 
Midline disc extrusion at L4-5 contributes to mild canal stenosis, encroaching 
on the exiting L5 nerve roots, appearing worse on the right. 
Mild to moderate canal stenosis at L2-3. 
No evidence for lumbar fracture or malignancy. There is endplate degenerative 
reactive marrow signal along the right posterior L3-4 and L4-5 interspaces. 
Coronal localizer images raise concern for possible hip avascular necrosis. Hip 
radiographs or MRI of the hips would be useful for further evaluation. 
Mild to moderate lumbar levoscoliosis.

## 2020-07-31 MED ORDER — BYSTOLIC 20 MG TABLET
20 | ORAL_TABLET | ORAL | 6 refills | 90.00000 days | Status: AC
Start: 2020-07-31 — End: ?

## 2021-01-22 IMAGING — DX HIP BILATERAL WITH PELVIS 5 VIEWS
2 series · 5 of 5 positions shown · non-contrast
Comparison: None.

FINAL Diagnostic Imaging Report 
________________________________________________________________________________________________ 
HIP BILATERAL WITH PELVIS 5 VIEWS, 01/22/2021 [DATE]: 
CLINICAL INDICATION: Right anterior rib pain for 6 months.

[AP (1 of 2)]
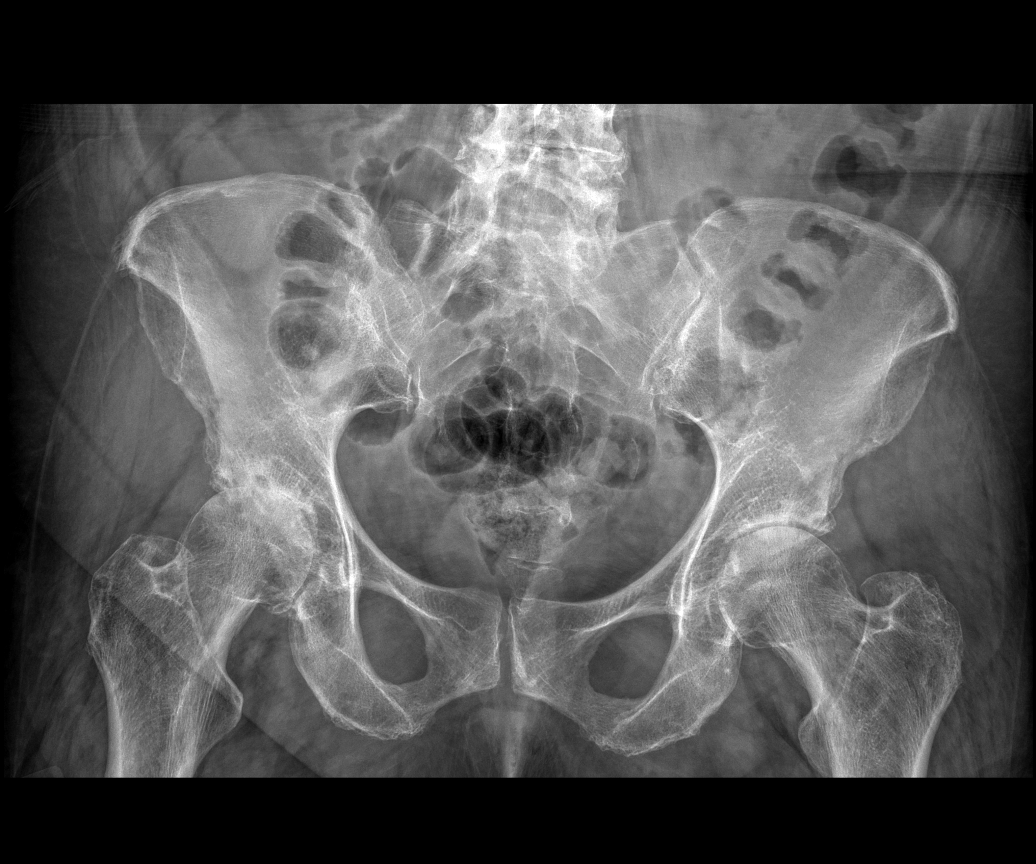

[Series 2: AP · 0.14mm/px · 4 of 4 slices shown (2 of 2)]
[im 1/4]
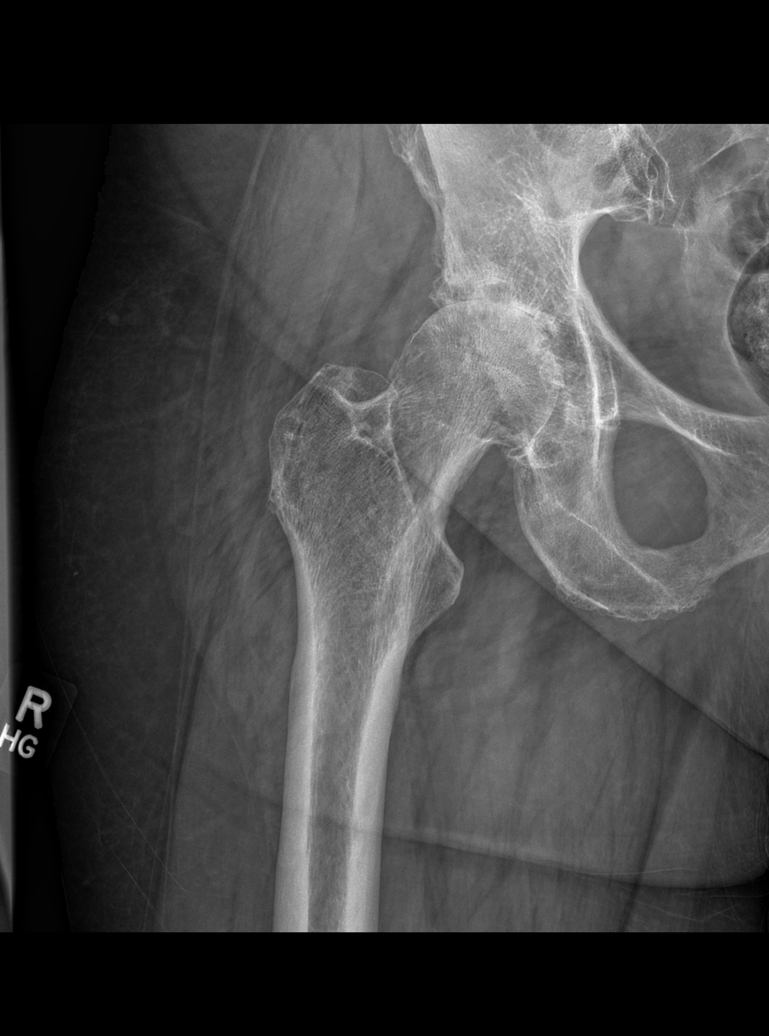
[im 2/4]
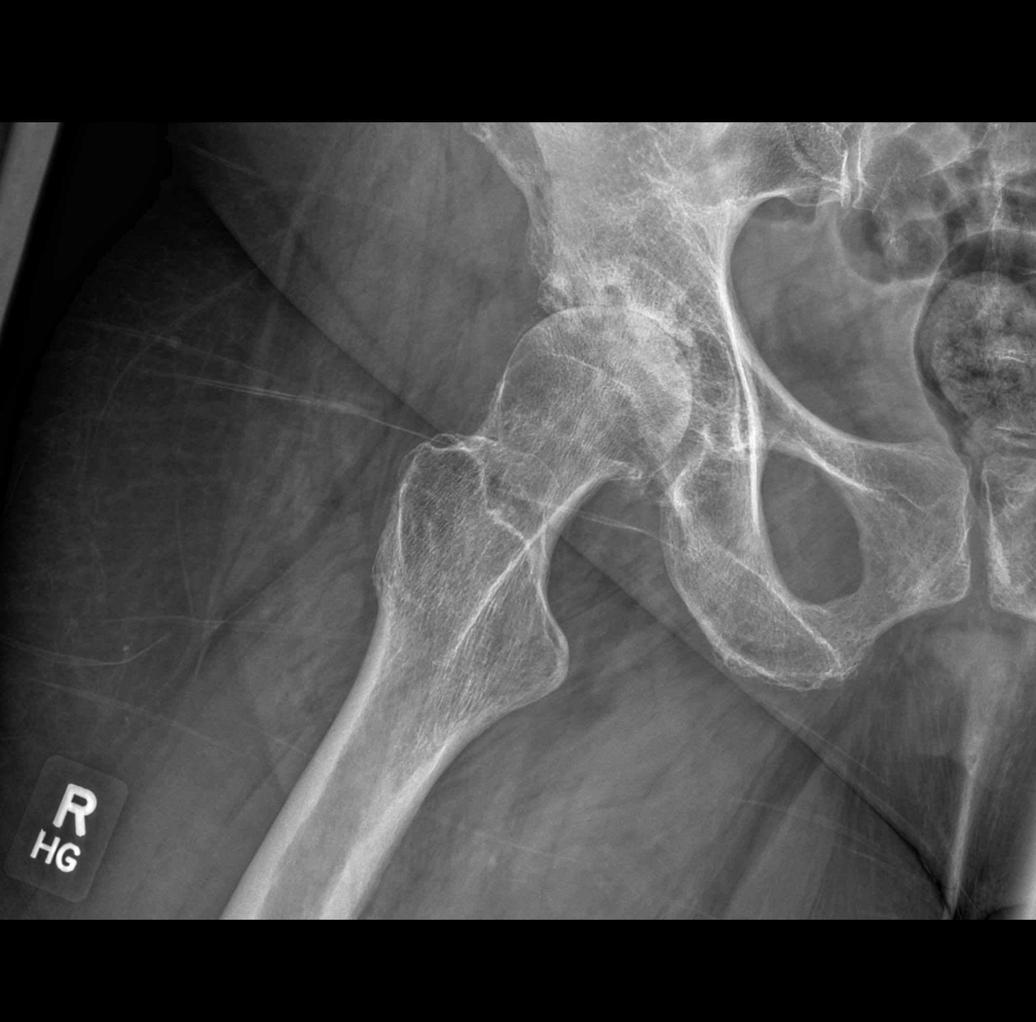
[im 3/4]
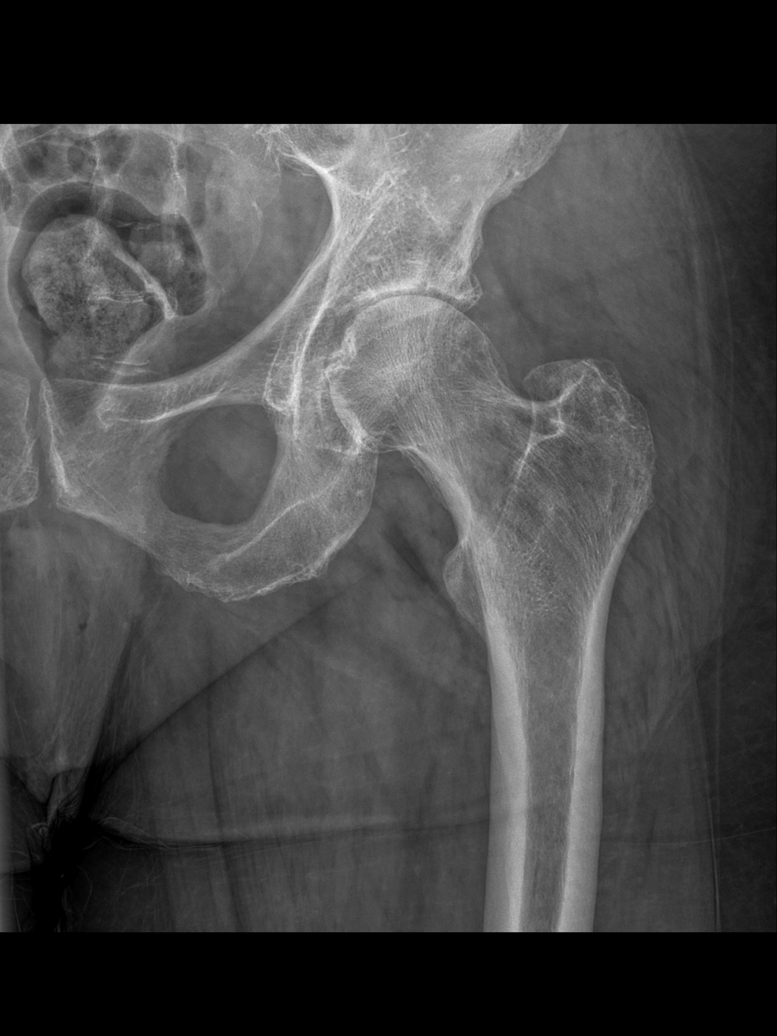
[im 4/4]
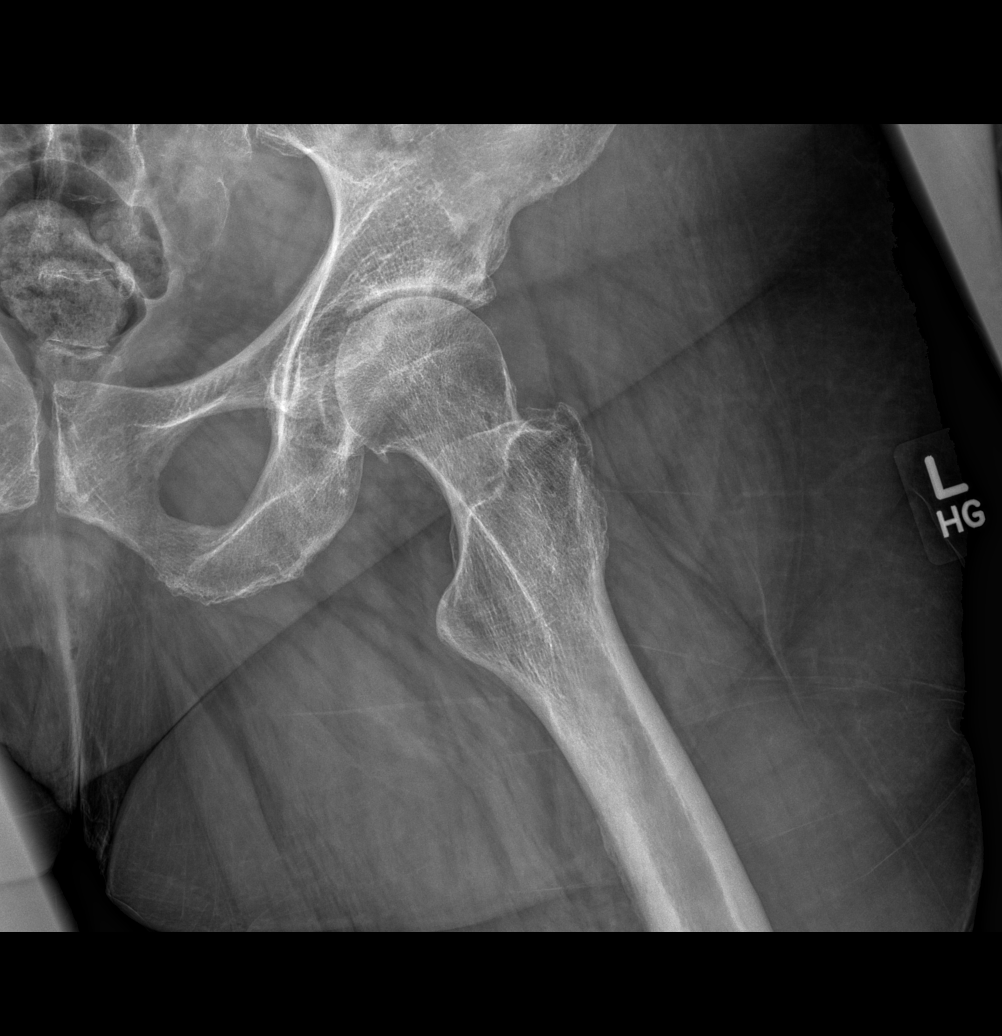

[5 of 5 positions shown; findings below may reference images not displayed]

FINDINGS: Severe degenerative narrowing of the joint with bone-on-bone and 
subchondral cysts. No fracture. 
Moderate degenerative narrowing LEFT hip joint. The bones are intact. 
Degenerative disc disease in the RIGHT L3-4 and L4-5.
IMPRESSION: Extensive degenerative change RIGHT hip. 
Moderate degenerative change LEFT hip

## 2021-02-07 IMAGING — MR MRI RIGHT HIP WITHOUT CONTRAST
5 of 7 series · 23 of 40 positions shown · IV contrast (gadolinium)
Comparison: 01/22/2021 radiographs

FINAL Diagnostic Imaging Report 
________________________________________________________________________________________________ 
MRI RIGHT HIP WITHOUT CONTRAST, 02/07/2021 [DATE]: 
CLINICAL INDICATION: Increasing right hip pain. Rule out AVN or osteoarthritis.
TECHNIQUE: Multiplanar, multiecho position MR images of the hip were performed 
without intravenous gadolinium enhancement. Large field-of-view images were 
performed of the pelvis to include the contralateral hip for comparison. Patient 
was scanned on a 1.5T magnet.

[Series 101: survey_fullfov_transversal · axial · 10.0mm · 1.84mm/px · z∈[-51,+51]mm · 2 of 7 slices shown]
[im 1/7]
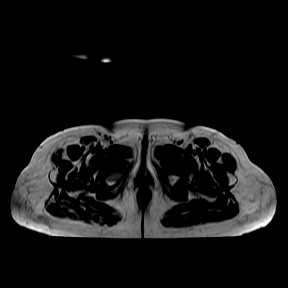
[im 7/7]
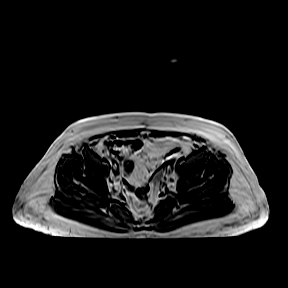

[Series 201: survey · axial · 10.0mm · 1.14mm/px · z∈[-20,+199]mm · 3 of 11 slices shown]
[im 1/11]
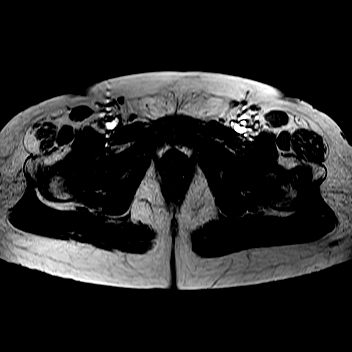
[im 6/11]
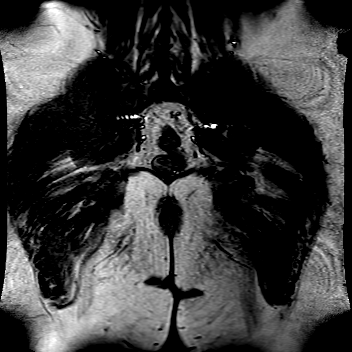
[im 11/11]
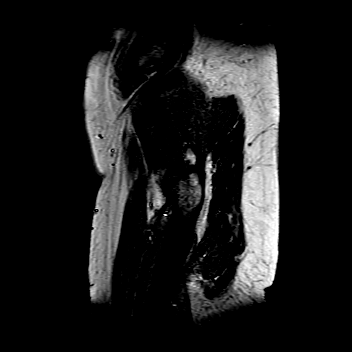

[Series 301: stir_cor-pelvis · coronal · 5.0mm · 0.78mm/px · 8 of 30 slices shown]
[im 1/30]
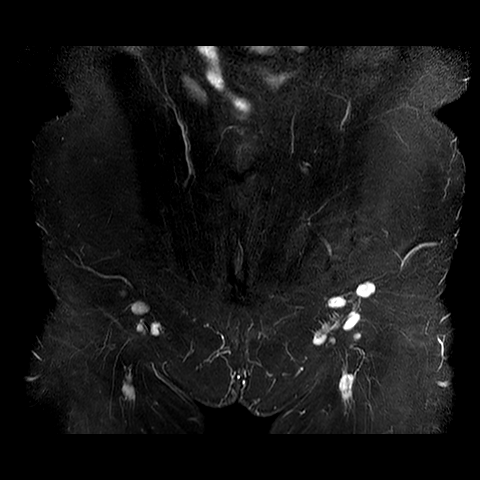
[im 5/30]
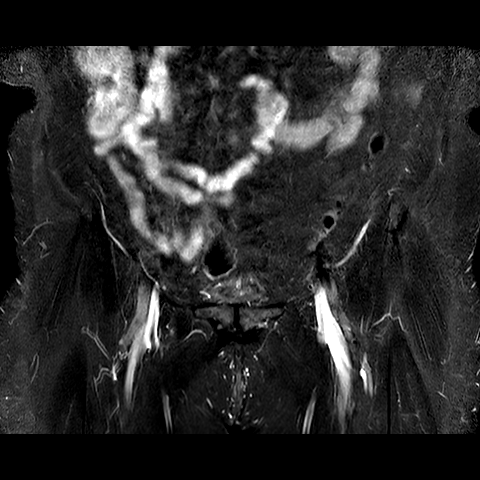
[im 9/30]
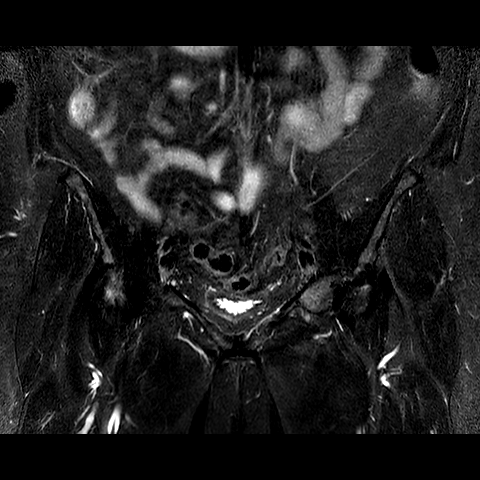
[im 13/30]
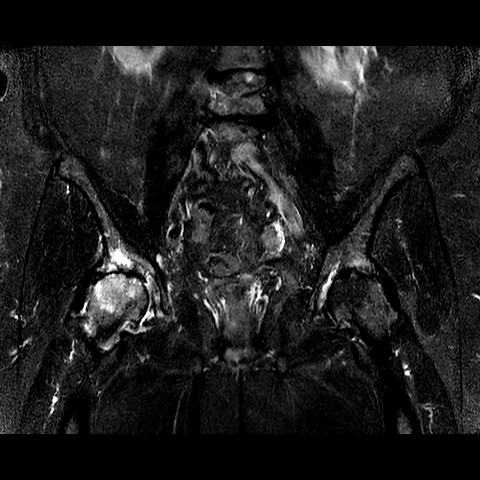
[im 17/30]
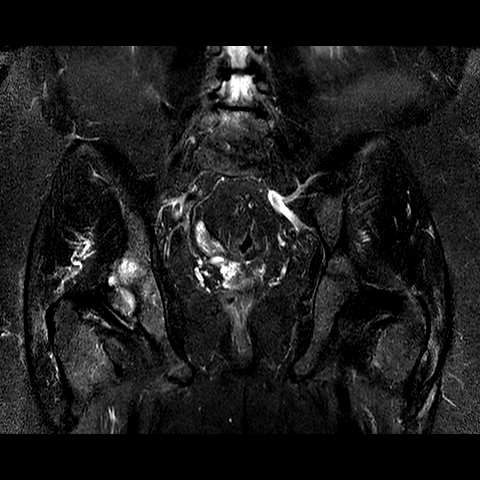
[im 21/30]
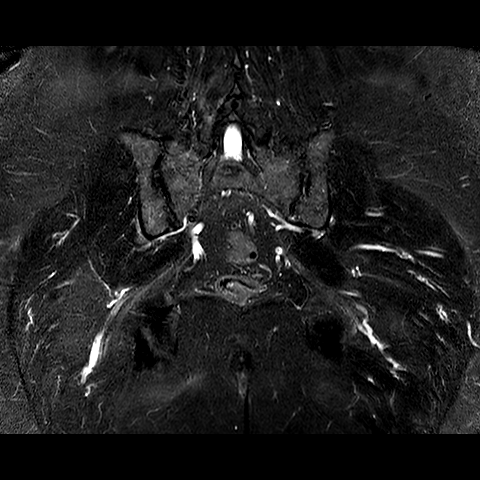
[im 25/30]
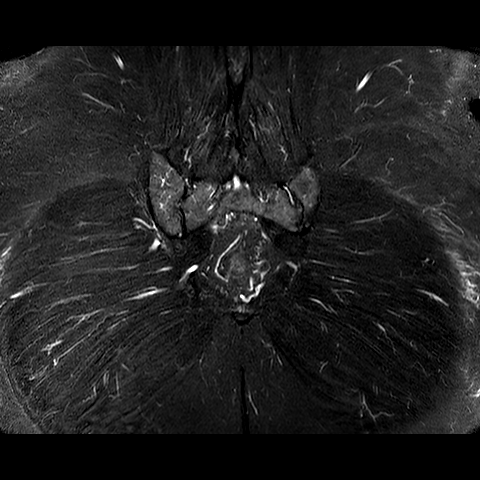
[im 30/30]
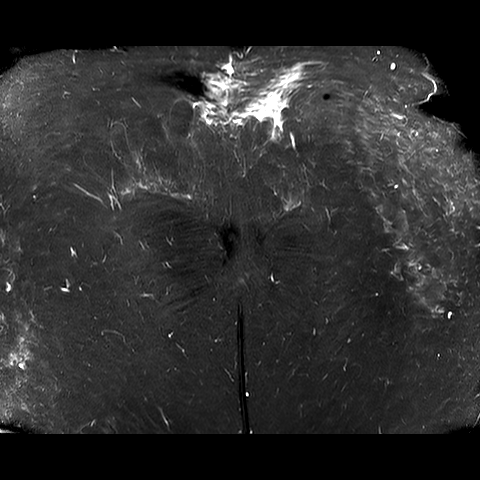

[Series 401: t1_tra-pelvis · axial · 6.0mm · 0.78mm/px · z∈[-102,+130]mm · 8 of 30 slices shown]
[im 1/30]
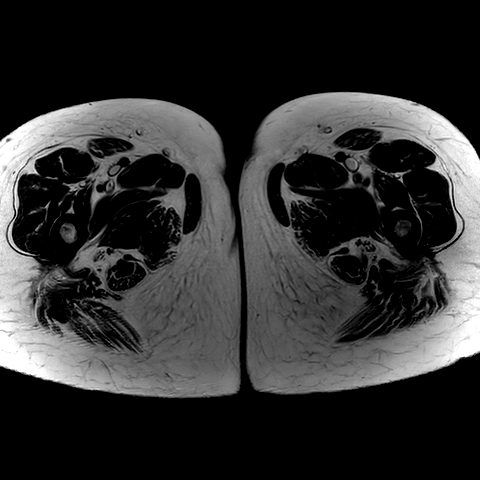
[im 5/30]
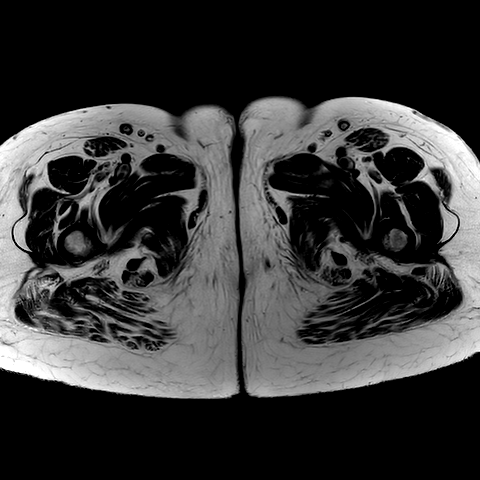
[im 9/30]
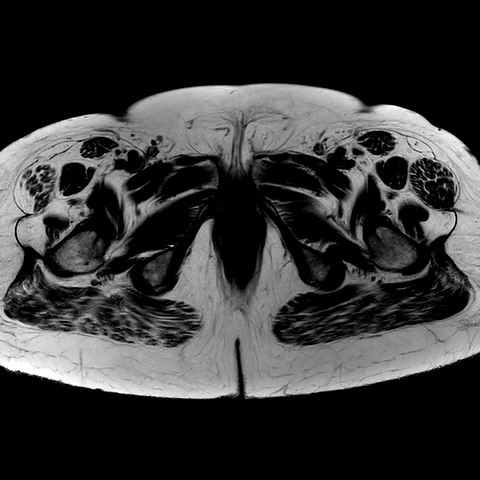
[im 13/30]
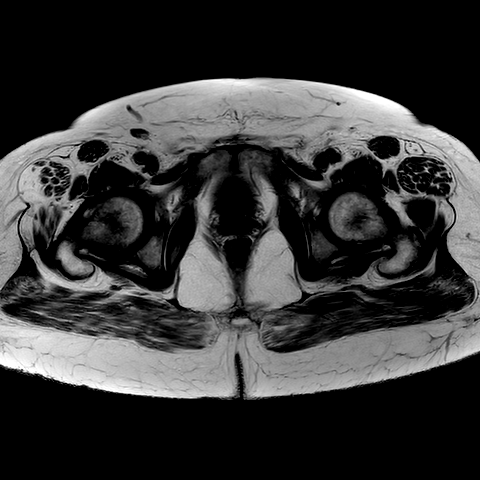
[im 17/30]
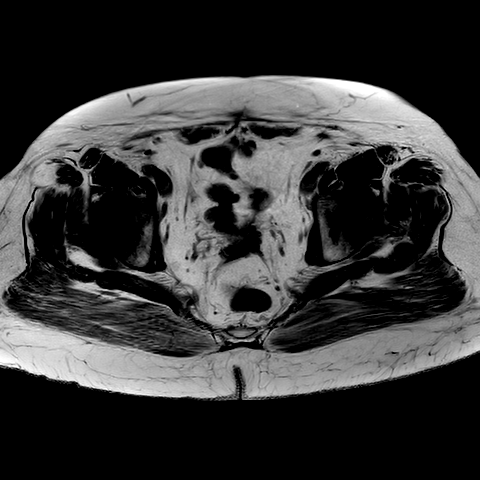
[im 21/30]
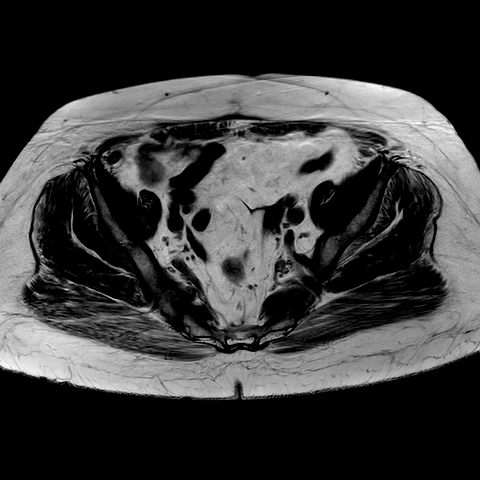
[im 25/30]
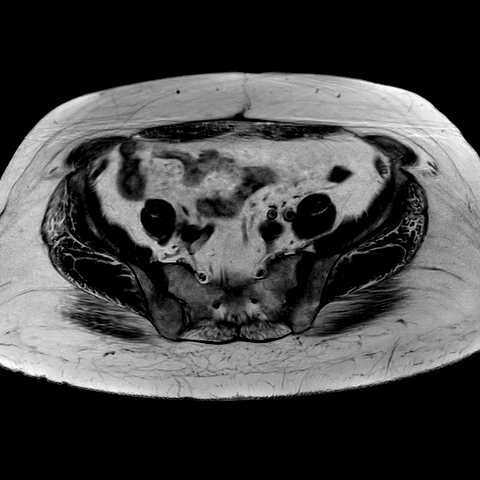
[im 30/30]
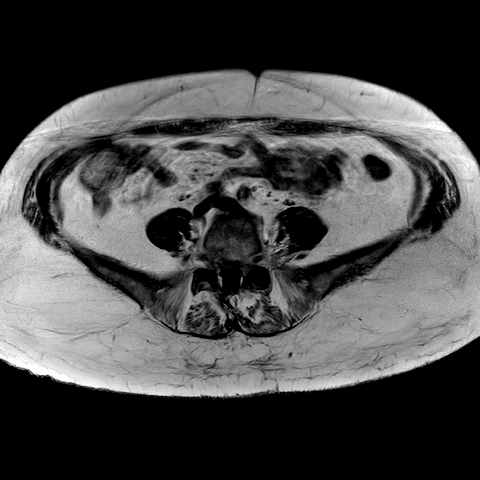

[Series 501: pd_fs_sag · sagittal · 4.0mm · 0.45mm/px · 2 of 26 slices shown]
[im 1/26]
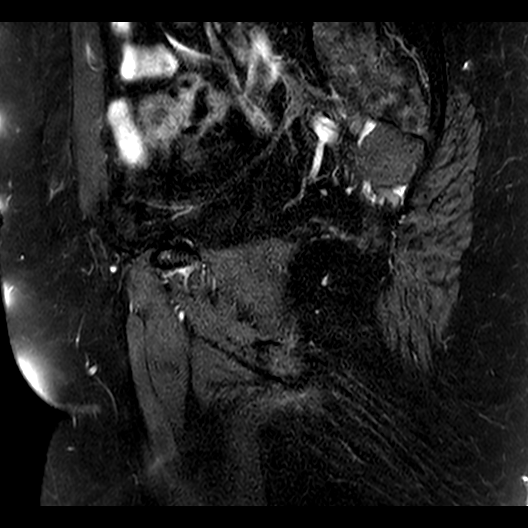
[im 5/26]
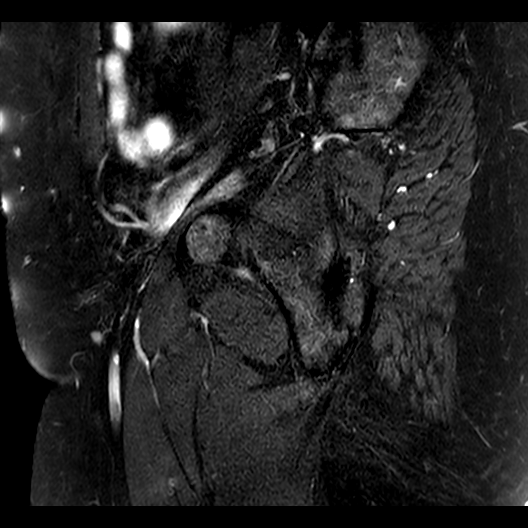

[23 of 40 positions shown; findings below may reference images not displayed]

FINDINGS: HIPS:  Marked degenerative change of the right hip with full-thickness 
chondromalacia, subcortical cysts, marginal osteophytes, marrow edema, moderate 
joint effusion and synovitis. Moderate degenerative change of the left hip with 
partial thickness chondromalacia, acetabular subcortical cysts and marrow edema. 
Bilateral degenerative labral tears. No paralabral cyst. No hip joint effusion. 
Both femoral heads maintain a spherical configuration without evidence of 
avascular necrosis or subarticular collapse. No abnormal morphology of the 
proximal femurs or acetabulum to predispose to impingement.  
BONES: Normal marrow signal intensity of the proximal hips, pelvis, sacrum and 
included lower lumbar spine. No fracture, contusion or marrow replacing lesion. 
Included lumbar spine demonstrates multilevel degenerative change. SI joints are 
preserved. 
SOFT TISSUES: The bilateral abductor cuffs are preserved. The insertions of the 
iliopsoas tendons are intact. The origins of the hamstrings are preserved. 
Rectus femoris-adductor complex is preserved. The musculature is symmetric 
without strain, atrophy or mass. No focal fluid collection or distended bursa. 
Specifically, no iliopsoas or trochanteric bursitis. Included neurovascular 
bundles are negative. Hysterectomy.
IMPRESSION: 1.  Marked right hip degenerative change, labral tears, moderate joint effusion 
and synovitis. 
2.  No osteonecrosis. 
3.  Moderate left hip degenerative change.  
4.  Multilevel degenerative change of the spine. 
5.  Hysterectomy.

## 2021-04-03 IMAGING — MG MAMMOGRAPHY SCREENING BILATERAL 3[PERSON_NAME]
8 series · 8 of 24 positions shown · non-contrast
Comparison: Comparison was made to prior examinations.

________________________________________________________________________________________________ 
MAMMOGRAPHY SCREENING BILATERAL [id number] NOGALES, 04/03/2021 [DATE]: 
CLINICAL INDICATION: Screening.
TECHNIQUE: Digital bilateral mammograms and 3-D Tomosynthesis were obtained. 
These were interpreted both primarily and with the aid of computer-aided 
detection system.  
BREAST DENSITY: (Level C) The breasts are heterogeneously dense, which may 
obscure small masses.

[L CC]
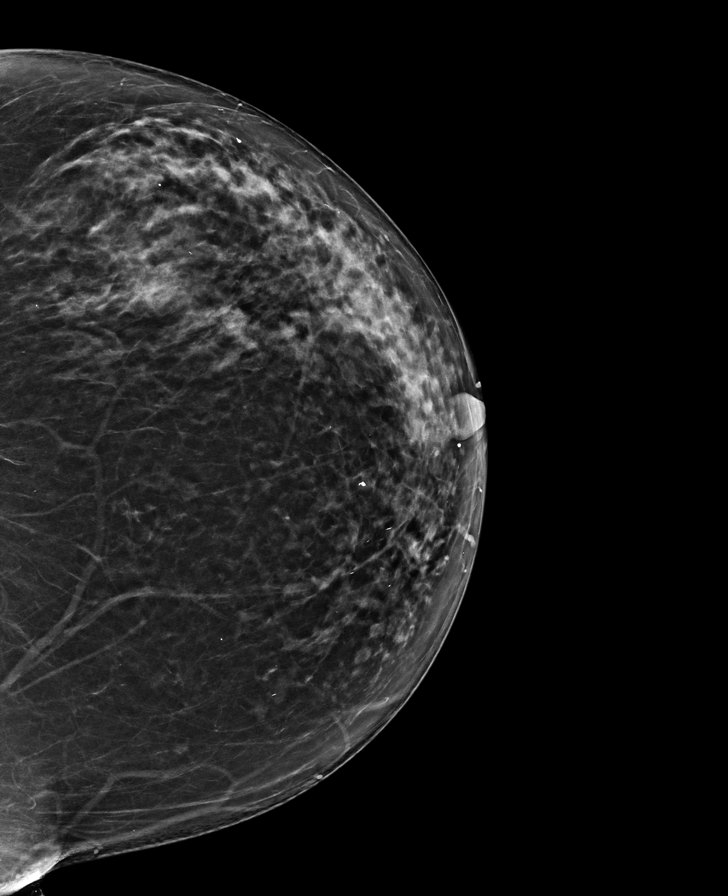

[R CC]
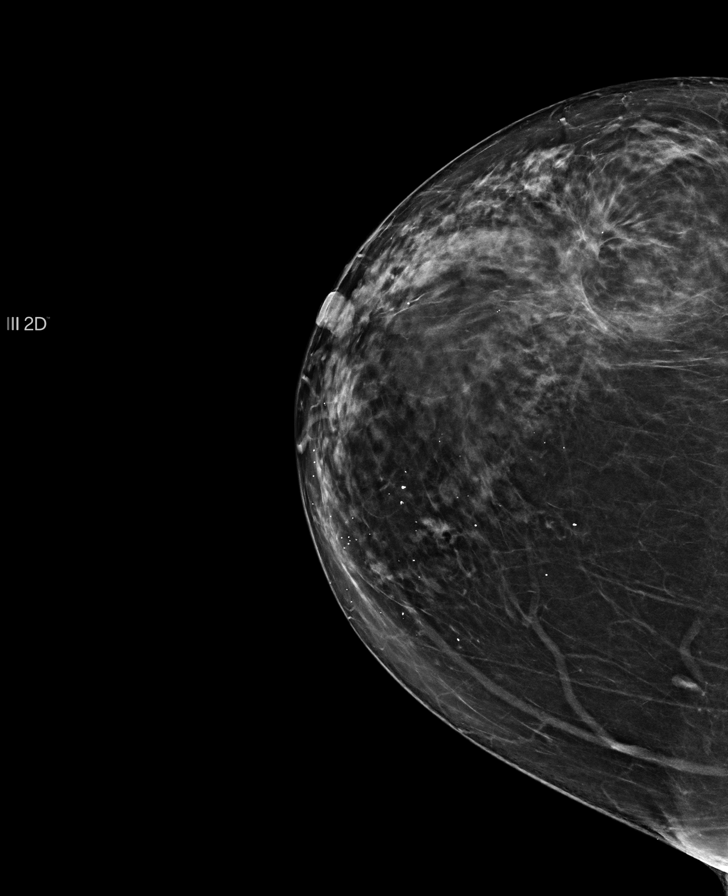

[R MLO]
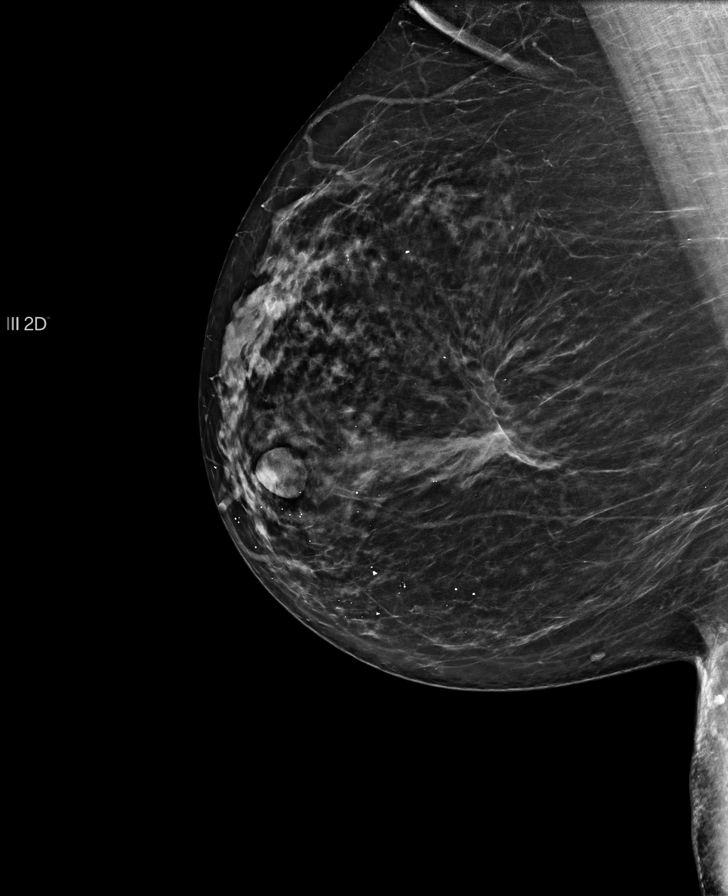

[L MLO]
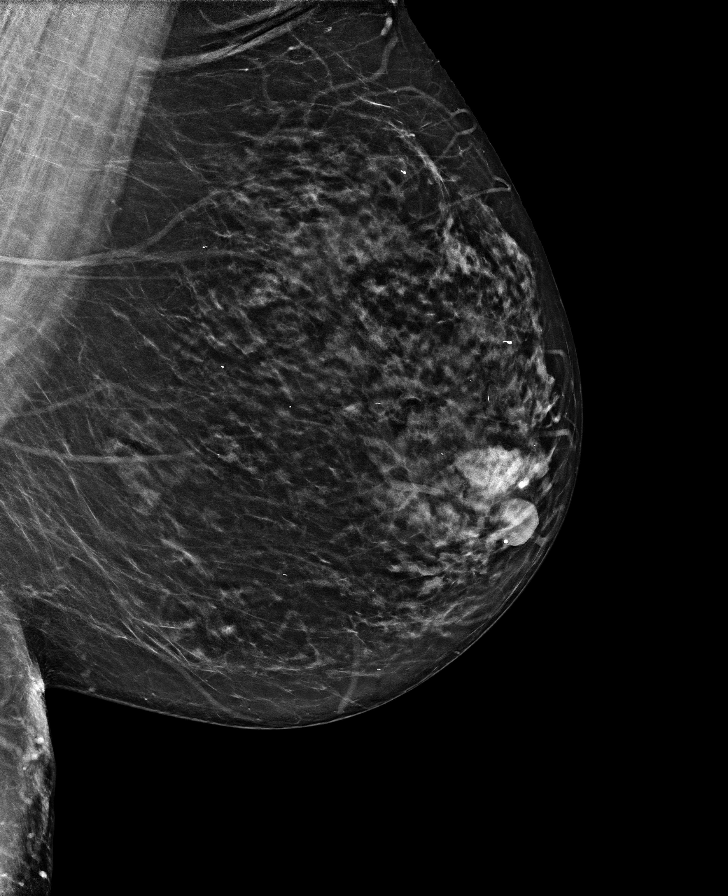

[R MLO tomo · tomo slice 33/66.0]
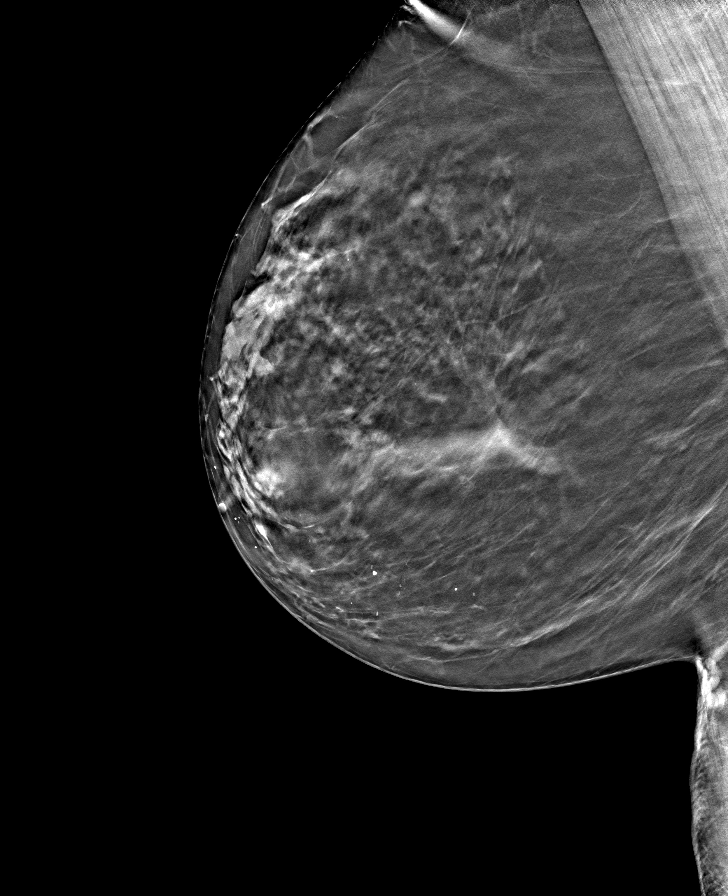

[R CC tomo · tomo slice 31/61.0]
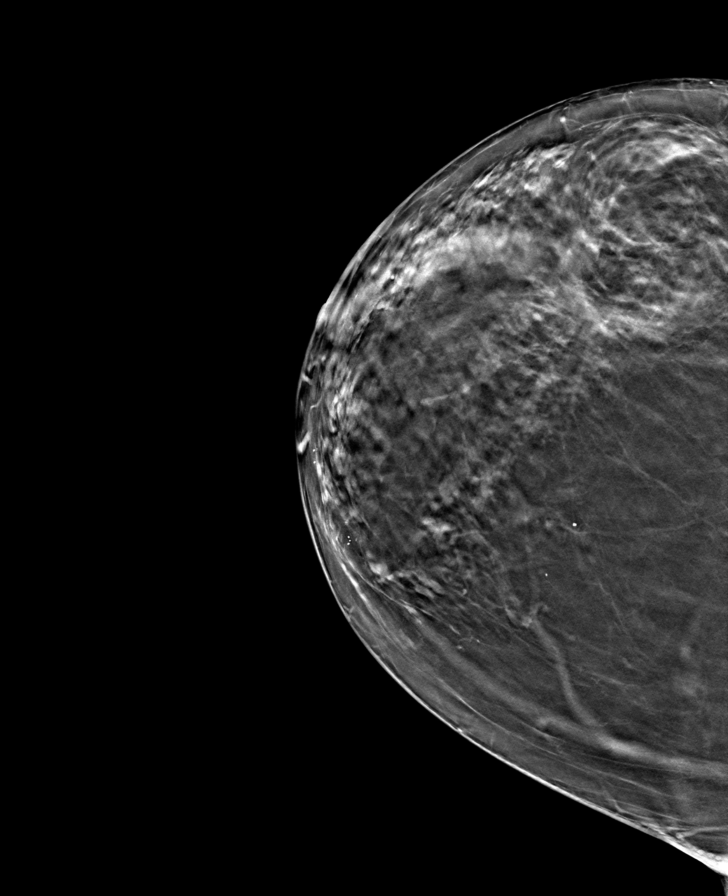

[L CC tomo · tomo slice 31/62.0]
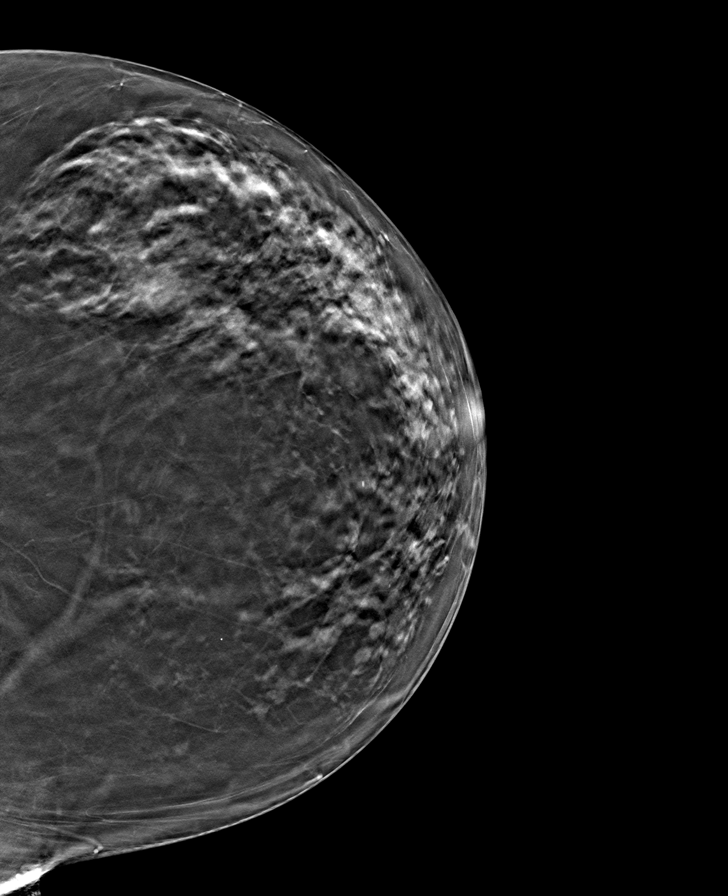

[L MLO tomo · tomo slice 35/69.0]
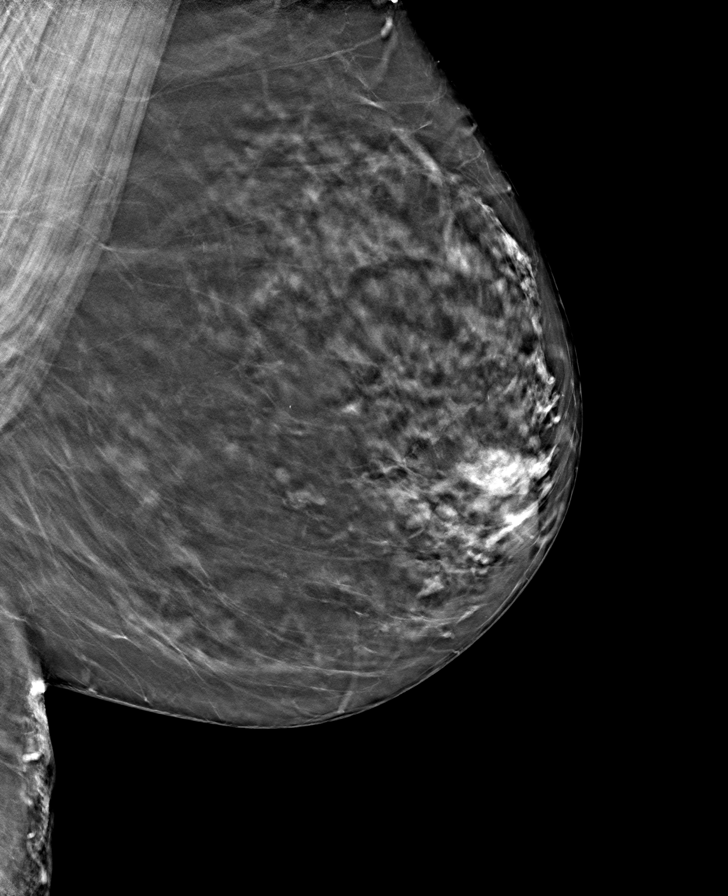

[8 of 24 positions shown; findings below may reference images not displayed]

FINDINGS: Benign calcification. Postbiopsy change right breast. No suspicious 
mass, calcifications, or area of architectural distortion in either breast.
IMPRESSION: Stable mammogram. 
(BI-RADS 2) Benign findings. Routine mammographic follow-up is recommended.

## 2023-08-08 IMAGING — MG MAMMOGRAPHY SCREENING BILATERAL 3[PERSON_NAME]
6 of 10 series · 6 of 30 positions shown · non-contrast
Comparison: Comparison was made to prior examinations.

________________________________________________________________________________________________ 
MAMMOGRAPHY SCREENING BILATERAL [id number] MOOLMAN, 08/08/2023 [DATE]: 
CLINICAL INDICATION: Encounter for screening mammogram.
TECHNIQUE: Digital bilateral mammograms and 3-D Tomosynthesis were obtained. 
These were interpreted both primarily and with the aid of computer-aided 
detection system.  
BREAST DENSITY: (Level C) The breasts are heterogeneously dense, which may 
obscure small masses.

[R MLO]
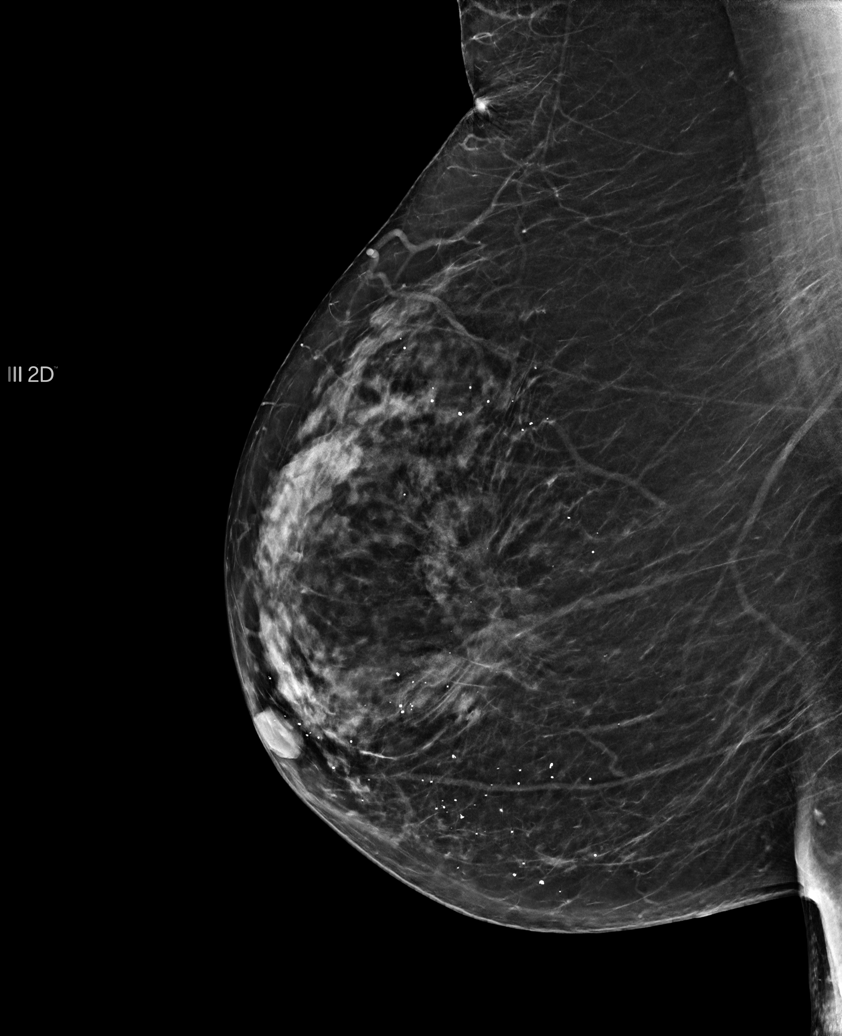

[L CC]
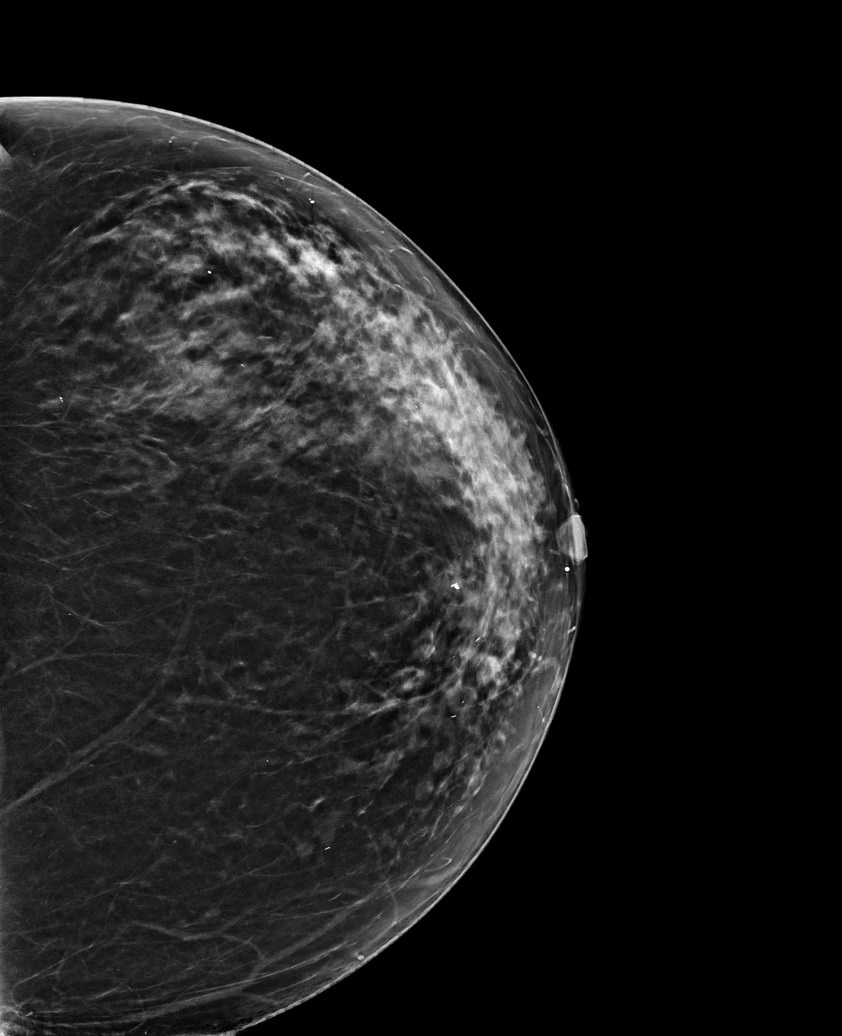

[L MLO]
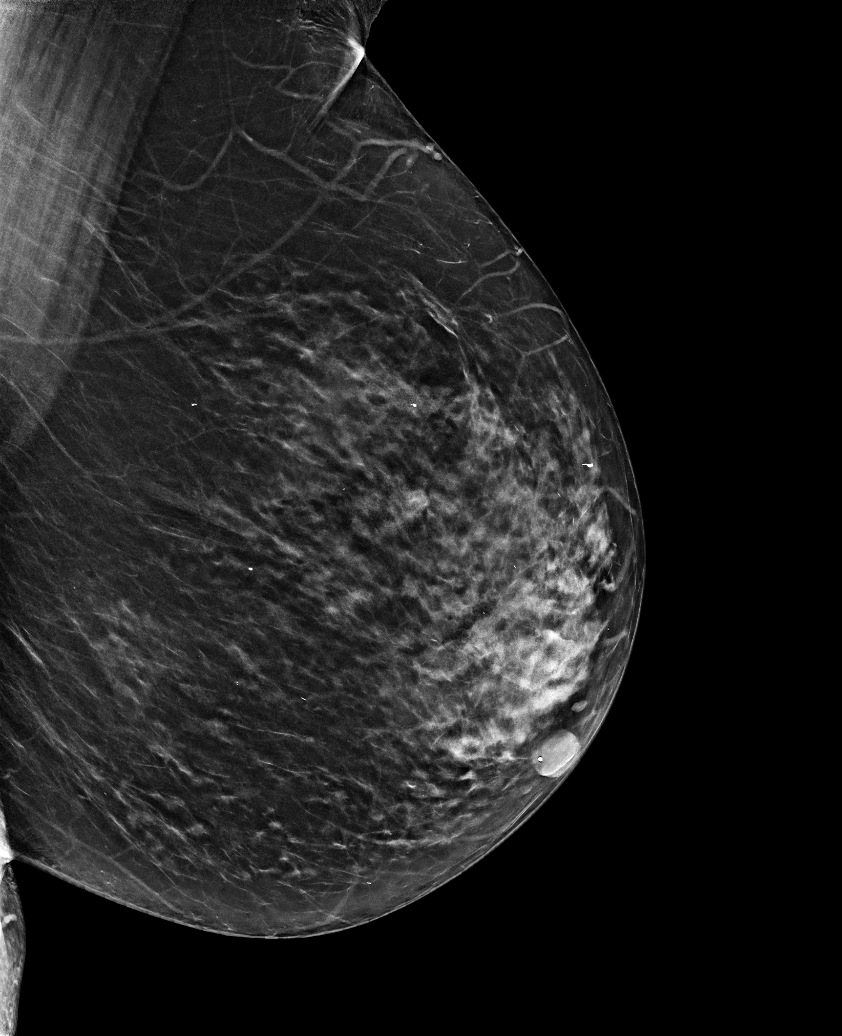

[R CC]
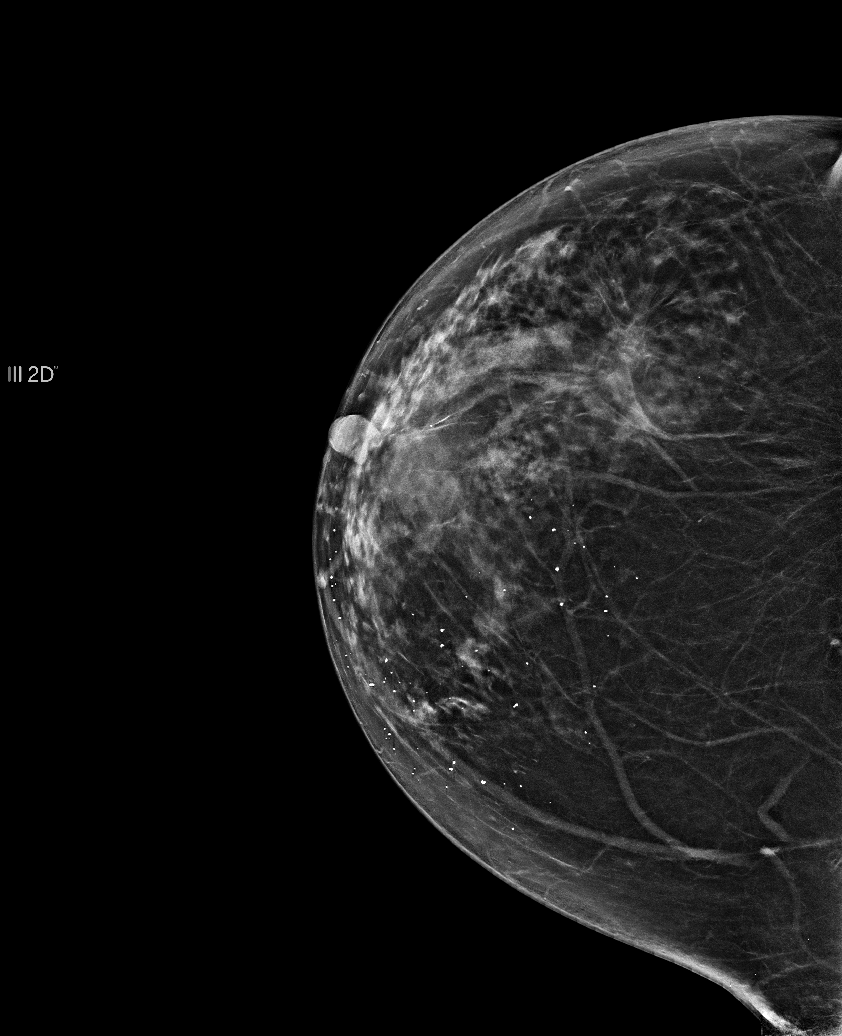

[L CV]
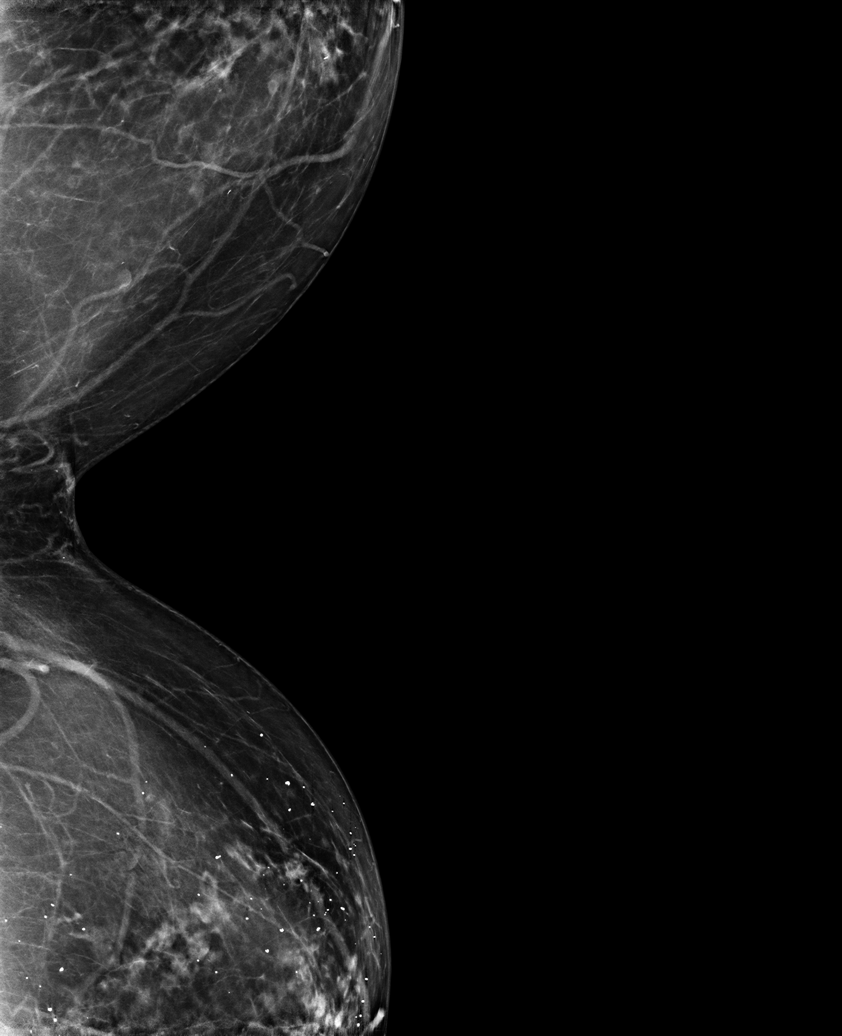

[R CC tomo · tomo slice 11/21.0]
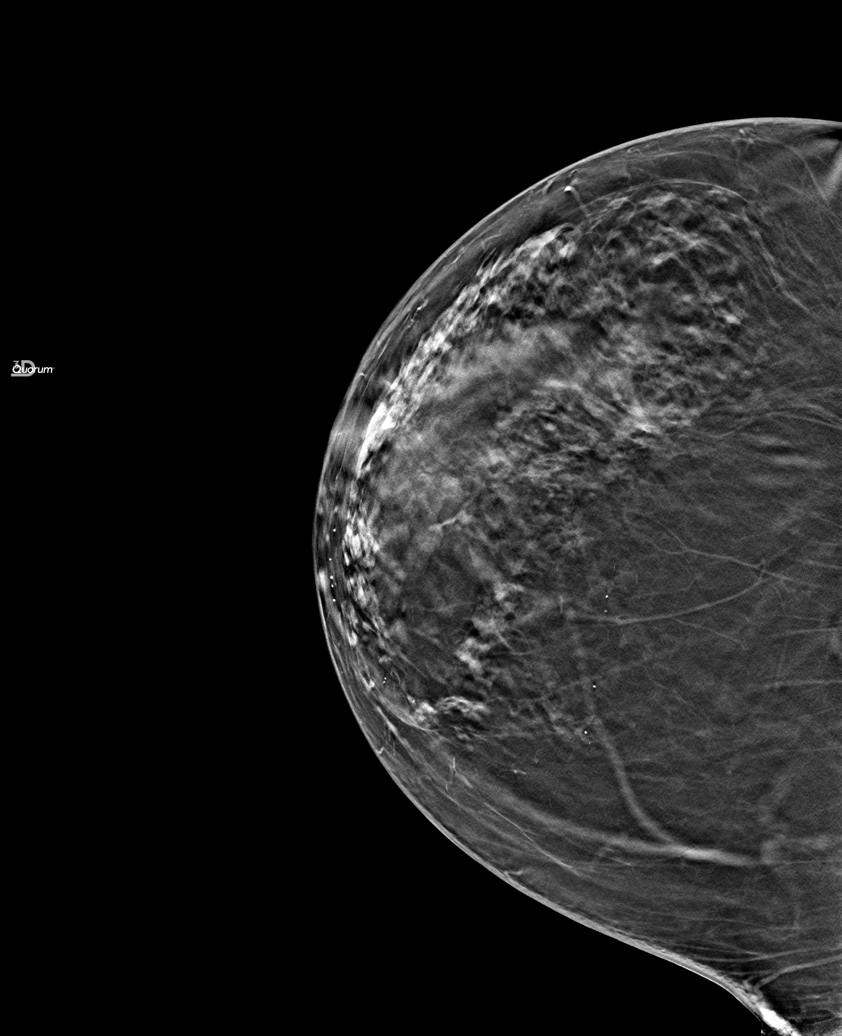

[6 of 30 positions shown; findings below may reference images not displayed]

FINDINGS: History of benign excisional biopsy on the right. Stable scarring. No 
new abnormality seen. Scattered benign calcifications.
IMPRESSION: (BI-RADS 2) Benign findings. Routine mammographic follow-up is recommended.
